# Patient Record
Sex: Male | Born: 1950 | Race: White | Hispanic: No | Marital: Married
Health system: Midwestern US, Community
[De-identification: ages and names within clinical notes are randomized; demographics above are authoritative.]

## PROBLEM LIST (undated history)

## (undated) DIAGNOSIS — K529 Noninfective gastroenteritis and colitis, unspecified: Secondary | ICD-10-CM

## (undated) DIAGNOSIS — E119 Type 2 diabetes mellitus without complications: Secondary | ICD-10-CM

## (undated) DIAGNOSIS — I519 Heart disease, unspecified: Secondary | ICD-10-CM

## (undated) DIAGNOSIS — K5190 Ulcerative colitis, unspecified, without complications: Secondary | ICD-10-CM

## (undated) HISTORY — PX: CARDIAC SURGERY: SHX584

## (undated) HISTORY — PX: CHOLECYSTECTOMY: SHX55

---

## 2016-12-26 ENCOUNTER — Ambulatory Visit (INDEPENDENT_AMBULATORY_CARE_PROVIDER_SITE_OTHER): Payer: 59

## 2016-12-26 ENCOUNTER — Ambulatory Visit (HOSPITAL_COMMUNITY)
Admission: EM | Admit: 2016-12-26 | Discharge: 2016-12-26 | Disposition: A | Discharge status: Home / Self Care | Payer: 59 | Attending: Internal Medicine | Admitting: Internal Medicine

## 2016-12-26 ENCOUNTER — Encounter (HOSPITAL_COMMUNITY): Payer: Self-pay | Admitting: *Deleted

## 2016-12-26 DIAGNOSIS — S82832A Other fracture of upper and lower end of left fibula, initial encounter for closed fracture: Secondary | ICD-10-CM

## 2016-12-26 HISTORY — DX: Heart disease, unspecified: I51.9

## 2016-12-26 HISTORY — DX: Type 2 diabetes mellitus without complications: E11.9

## 2016-12-26 MED ORDER — HYDROCODONE-ACETAMINOPHEN 5-325 MG PO TABS: 1.0000 | ORAL_TABLET | Freq: Four times a day (QID) | ORAL | 0 refills | Status: AC | PRN

## 2016-12-26 NOTE — Discharge Instructions (Signed)
There is a possibility of a nondisplaced distal fibula fracture. We are treating you today for fracture, and placed your foot in a cam walker. I have also prescribed a medicine for pain called hydrocodone, this medicine is a narcotic, it will cause drowsiness, and it is addictive. Do not take more than what is necessary, do not drink alcohol while taking, and do not operate any heavy machinery while taking this medicine. If you prefer, you may take over-the-counter ibuprofen with this medicine, he may also take over-the-counter Tylenol as well, however be aware that this medicine also has acetaminophen in it, and be careful not to go over 4000 mg of acetaminophen in any 24-hour period.  You will need to follow-up with an orthopedist for further care, and evaluation of your injury, since you are traveling, declined to make a referral to one of our local orthopedists. While you are traveling, take frequent breaks to walk, frequently flexure calf muscles, and whenever possible ambulate on the airplane. If your pain worsens, follow up with orthopedics, another urgent care by you're traveling, or if you're still in the local area, you may return to clinic

## 2016-12-26 NOTE — ED Triage Notes (Signed)
Pt  Reports  He  Twisted  His  l     Ankle        He  Has   Pain   And   Swelling to   l  Ankle  -    Pain  Is   Worse  On   Weight bearing

## 2016-12-26 NOTE — ED Provider Notes (Signed)
CSN: [id number]     Arrival date & time 12/26/16  1701 History   None    Chief Complaint  Patient presents with  . Ankle Pain   (Consider location/radiation/quality/duration/timing/severity/associated sxs/prior Treatment) 66 year old male presents to clinic with a chief complaint of left ankle pain. States he went to his son's graduation yesterday, and he fell in the parking lot twisting his ankle. Was able to walk afterwards, however pain is worsened over the last 24 hours, and is noted swelling to the ankle. Past medical history significant for diabetes, heart disease, states had 6 bypasses and open heart surgery. He has taken over-the-counter Aleve with some relief. Pain is aching, throbbing, dull, nonradiating with no other associated symptoms.   The history is provided by the patient.  Ankle Pain    Past Medical History:  Diagnosis Date  . Cardiac disease   . Diabetes mellitus without complication [facility])    Past Surgical History:  Procedure Laterality Date  . CARDIAC SURGERY    . CHOLECYSTECTOMY     History reviewed. No pertinent family history. Social History  Substance Use Topics  . Smoking status: Never Smoker  . Smokeless tobacco: Never Used  . Alcohol use Yes     Comment: socially    Review of Systems  Constitutional: Negative.   HENT: Negative.   Respiratory: Negative.   Cardiovascular: Negative.   Musculoskeletal: Positive for joint swelling.  Skin: Negative.   Neurological: Negative.     Allergies  Patient has no known allergies.  Home Medications   Prior to Admission medications   Medication Sig Start Date End Date Taking? Authorizing Provider  aspirin EC 81 MG tablet Take 81 mg by mouth daily.   Yes Historical Provider, MD  Atorvastatin Calcium (LIPITOR PO) Take by mouth.   Yes Historical Provider, MD  B Complex-C-Biotin-D-Zinc-FA (VITAL-D RX PO) Take by mouth.   Yes Historical Provider, MD  co-enzyme Q-10 30 MG capsule Take 30 mg by mouth 3 (three)  times daily.   Yes Historical Provider, MD  Empagliflozin (JARDIANCE PO) Take by mouth.   Yes Historical Provider, MD  KRILL OIL PO Take by mouth.   Yes Historical Provider, MD  Mesalamine (APRISO PO) Take by mouth.   Yes Historical Provider, MD  mesalamine (APRISO) 0.375 g 24 hr capsule Take 375 mg by mouth daily.   Yes Historical Provider, MD  PIOGLITAZONE HCL PO Take by mouth.   Yes Historical Provider, MD  Probiotic Product (ALIGN PO) Take by mouth.   Yes Historical Provider, MD  SAXagliptin-MetFORMIN (KOMBIGLYZE XR PO) Take by mouth.   Yes Historical Provider, MD  Sertraline HCl (ZOLOFT PO) Take by mouth.   Yes Historical Provider, MD  Valsartan (DIOVAN PO) Take by mouth.   Yes Historical Provider, MD  VALSARTAN PO Take by mouth.   Yes Historical Provider, MD  Vardenafil HCl (LEVITRA PO) Take by mouth.   Yes Historical Provider, MD  Zolpidem Tartrate (AMBIEN PO) Take by mouth.   Yes Historical Provider, MD  HYDROcodone-acetaminophen (NORCO/VICODIN) 5-325 MG tablet Take 1 tablet by mouth every 6 (six) hours as needed. 12/26/16   Dorena Bodo, NP   Meds Ordered and Administered this Visit  Medications - No data to display  BP (!) 145/83 (BP Location: Right Arm)   Pulse 78   Temp 98.6 F (37 C) (Oral)   Resp 18   SpO2 100%  No data found.   Physical Exam  Constitutional: He is oriented to person, place, and time.  He appears well-developed and well-nourished. No distress.  HENT:  Head: Normocephalic and atraumatic.  Right Ear: External ear normal.  Left Ear: External ear normal.  Eyes: Conjunctivae are normal.  Neck: Normal range of motion.  Musculoskeletal: He exhibits edema and tenderness.       Left ankle: He exhibits decreased range of motion and swelling. He exhibits normal pulse. Tenderness. Lateral malleolus tenderness found.  Neurological: He is alert and oriented to person, place, and time.  Skin: Skin is warm and dry. Capillary refill takes less than 2 seconds. He  is not diaphoretic.  Psychiatric: He has a normal mood and affect. His behavior is normal.  Nursing note and vitals reviewed.   Urgent Care Course     Procedures (including critical care time)  Labs Review Labs Reviewed - No data to display  Imaging Review Dg Ankle Complete Left  Result Date: 12/26/2016 CLINICAL DATA:  66 y/o M; ankle injury with lateral swelling and tenderness. EXAM: LEFT ANKLE COMPLETE - 3+ VIEW COMPARISON:  None. FINDINGS: Transverse lucency through the tip of the fibula below level of tibial plafond appears to have corticated margins and may represent an os subfibulare. Nondisplaced fracture is also possible. Soft tissue swelling overlying the lateral malleolus. Talar dome is intact. Ankle mortise is symmetric on these nonstress views. IMPRESSION: 1. Transverse lucency through the tip of the fibula below level of tibial plafond appears to have corticated margins and may represent an os subfibulare. Nondisplaced acute fracture is also possible. 2. Soft tissue swelling overlying the lateral malleolus. 3. No other possible fracture identified. Electronically Signed   By: Mitzi HansenLance  Furusawa-Stratton M.D.   On: 12/26/2016 17:47      MDM   1. Closed fracture of distal end of left fibula, unspecified fracture morphology, initial encounter    Treating for a nondisplaced distal fibula fracture. The placed in a cam walker, provided crutches, prescription given for hydrocodone. Recommend following up with orthopedics in one week for further evaluation and management of symptoms. Provided counseling on clot prophylaxis such as mobility and frequent ambulation as the patient is traveling, and will be flying.    Dorena BodoLawrence Elbert Polyakov, NP 12/26/16 1944

## 2018-08-20 IMAGING — DX DG ANKLE COMPLETE 3+V*L*
3 series · 3 of 3 positions shown · non-contrast
Comparison: None.

CLINICAL DATA: 65 y/o M; ankle injury with lateral swelling and
tenderness.

EXAM:
LEFT ANKLE COMPLETE - 3+ VIEW

[ankle ap]
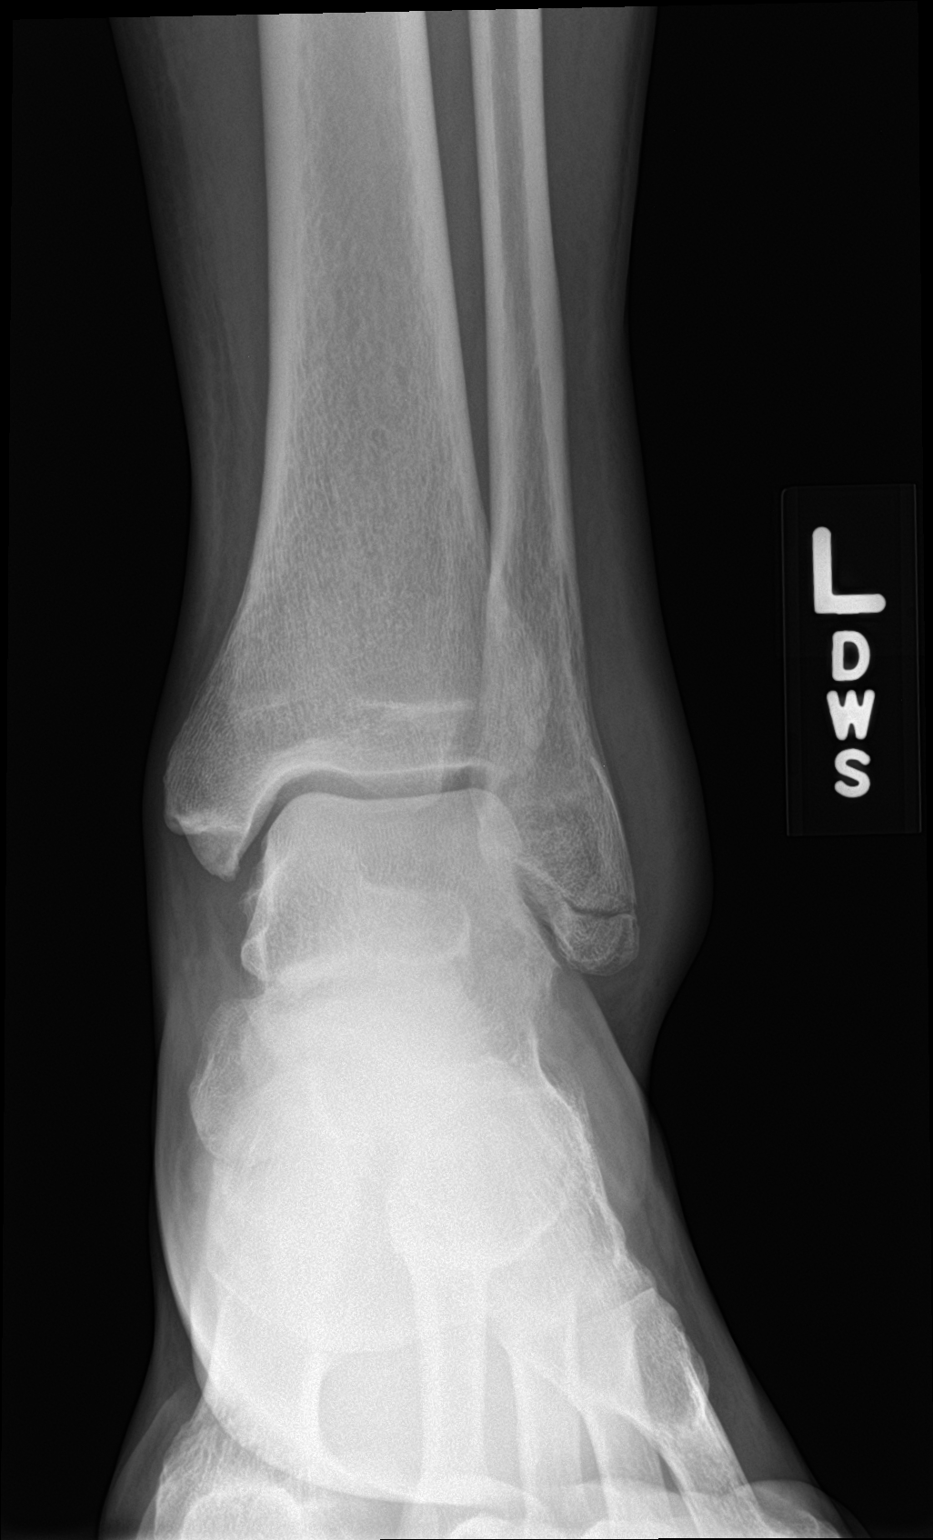

[ankle obl]
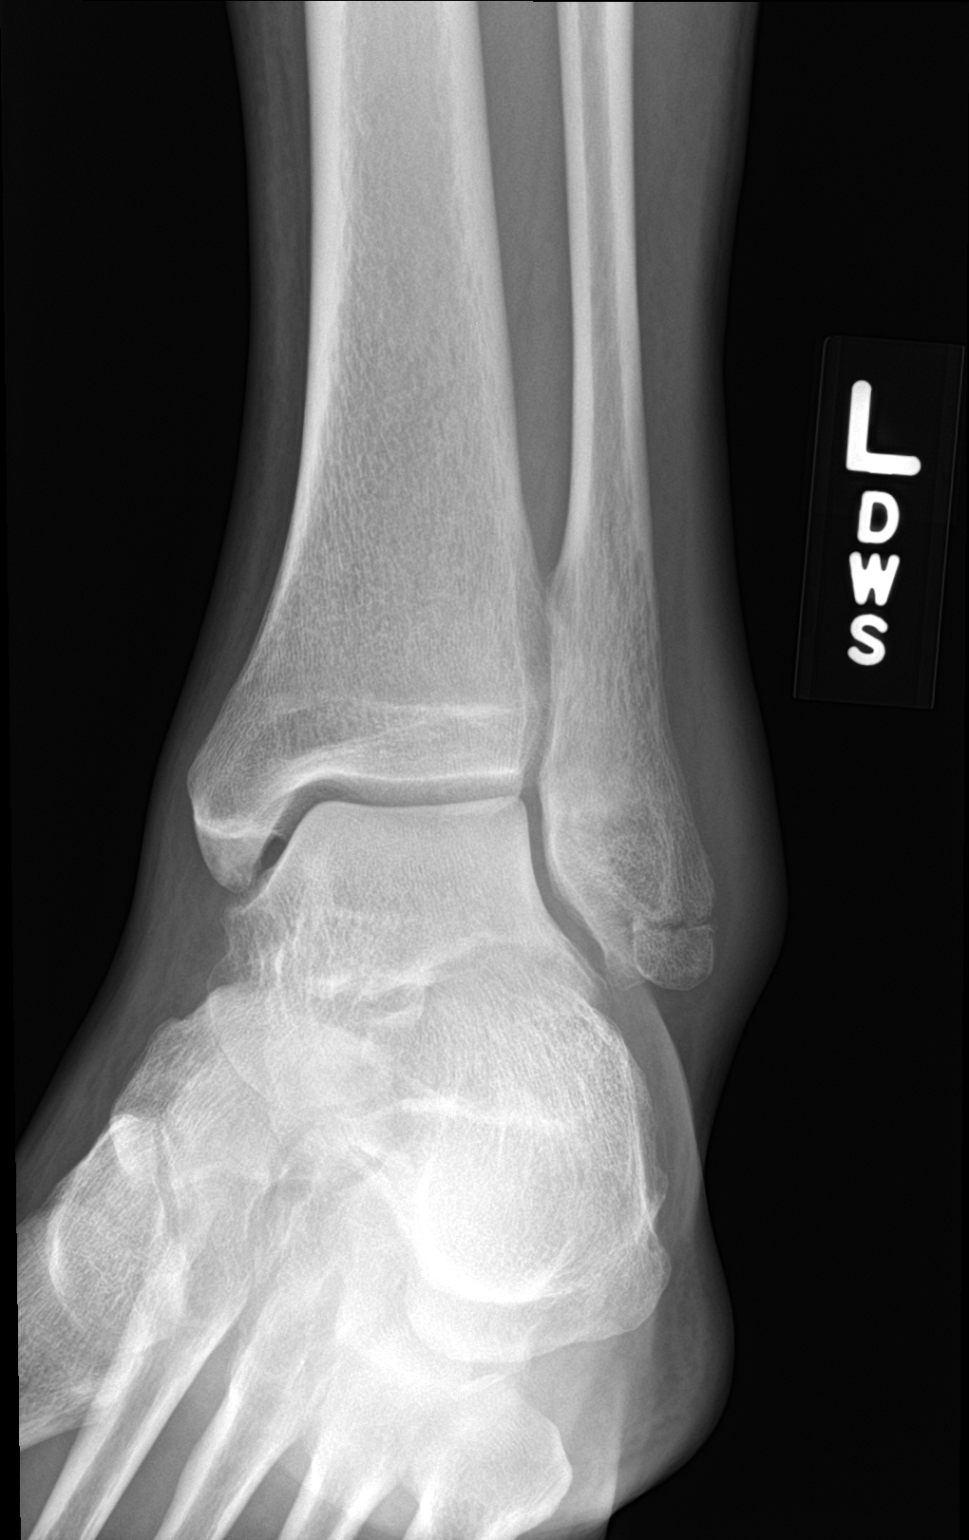

[ankle lat]
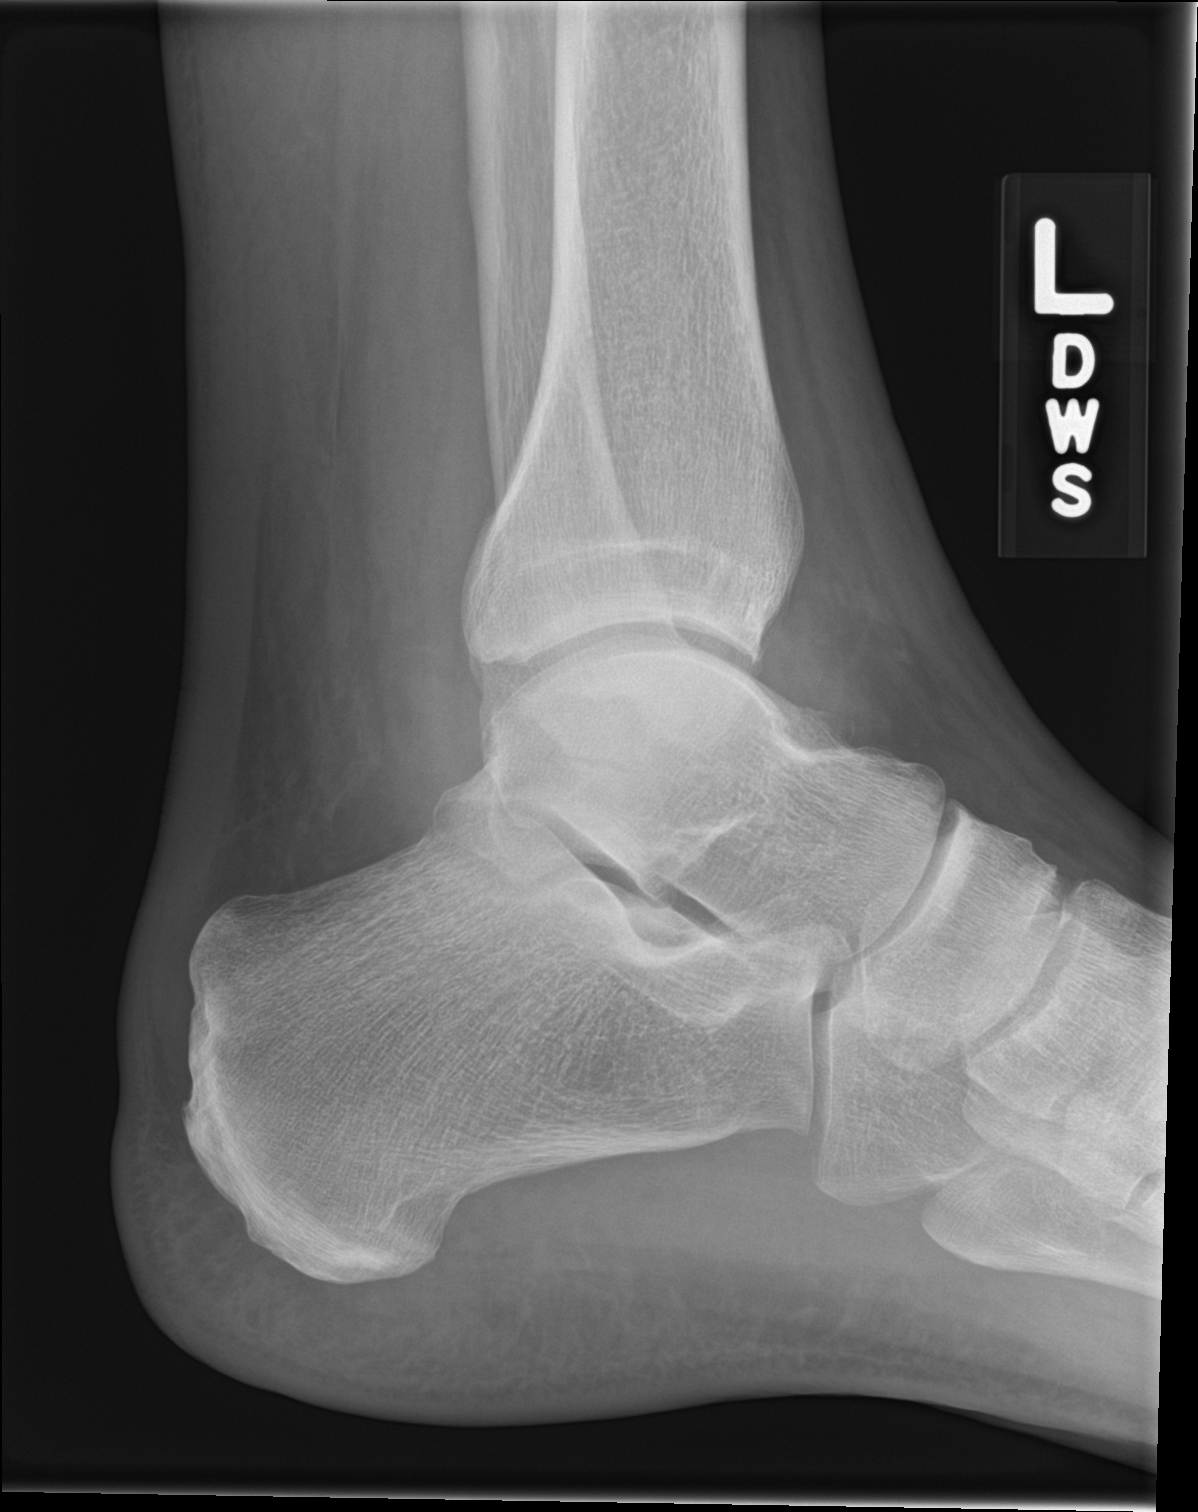

[3 of 3 positions shown; findings below may reference images not displayed]

FINDINGS: Transverse lucency through the tip of the fibula below level of
tibial plafond appears to have corticated margins and may represent
an os subfibulare. Nondisplaced fracture is also possible. Soft
tissue swelling overlying the lateral malleolus. Talar dome is
intact. Ankle mortise is symmetric on these nonstress views.
IMPRESSION: 1. Transverse lucency through the tip of the fibula below level of
tibial plafond appears to have corticated margins and may represent
an os subfibulare. Nondisplaced acute fracture is also possible.
2. Soft tissue swelling overlying the lateral malleolus.
3. No other possible fracture identified.

By: Mykola Marius M.D.

## 2021-08-16 ENCOUNTER — Inpatient Hospital Stay: Payer: MEDICARE

## 2021-08-16 MED ORDER — FENTANYL CITRATE (PF) 50 MCG/ML IJ SOLN
50 mcg/mL | INTRAMUSCULAR | Status: DC | PRN
Start: 2021-08-16 — End: 2021-08-16

## 2021-08-16 MED ORDER — ATROPINE 0.1 MG/ML SYRINGE
0.1 mg/mL | Freq: Once | INTRAMUSCULAR | Status: DC | PRN
Start: 2021-08-16 — End: 2021-08-16

## 2021-08-16 MED ORDER — SIMETHICONE 40 MG/0.6 ML ORAL DROPS, SUSP
40 mg/0.6 mL | ORAL | Status: DC | PRN
Start: 2021-08-16 — End: 2021-08-16
  Administered 2021-08-16: 13:00:00 via ORAL

## 2021-08-16 MED ORDER — FLUMAZENIL 0.1 MG/ML IV SOLN
0.1 mg/mL | INTRAVENOUS | Status: DC | PRN
Start: 2021-08-16 — End: 2021-08-16

## 2021-08-16 MED ORDER — LIDOCAINE (PF) 20 MG/ML (2 %) IJ SOLN
20 mg/mL (2 %) | INTRAMUSCULAR | Status: DC | PRN
Start: 2021-08-16 — End: 2021-08-16
  Administered 2021-08-16: 13:00:00 via INTRAVENOUS

## 2021-08-16 MED ORDER — EPINEPHRINE 0.1 MG/ML SYRINGE
0.1 mg/mL | Freq: Once | INTRAMUSCULAR | Status: DC | PRN
Start: 2021-08-16 — End: 2021-08-16

## 2021-08-16 MED ORDER — EPHEDRINE SULFATE 50 MG/ML INTRAVENOUS SOLUTION
50 mg/mL | INTRAVENOUS | Status: DC | PRN
Start: 2021-08-16 — End: 2021-08-16
  Administered 2021-08-16 (×3): via INTRAVENOUS

## 2021-08-16 MED ORDER — SODIUM CHLORIDE 0.9 % IV
INTRAVENOUS | Status: DC
Start: 2021-08-16 — End: 2021-08-16

## 2021-08-16 MED ORDER — SODIUM CHLORIDE 0.9 % IJ SYRG
INTRAMUSCULAR | Status: DC | PRN
Start: 2021-08-16 — End: 2021-08-16

## 2021-08-16 MED ORDER — SODIUM CHLORIDE 0.9 % IV
INTRAVENOUS | Status: DC | PRN
Start: 2021-08-16 — End: 2021-08-16
  Administered 2021-08-16: 13:00:00 via INTRAVENOUS

## 2021-08-16 MED ORDER — SODIUM CHLORIDE 0.9 % IJ SYRG
Freq: Three times a day (TID) | INTRAMUSCULAR | Status: DC
Start: 2021-08-16 — End: 2021-08-16

## 2021-08-16 MED ORDER — PROPOFOL 10 MG/ML IV EMUL
10 mg/mL | INTRAVENOUS | Status: DC | PRN
Start: 2021-08-16 — End: 2021-08-16
  Administered 2021-08-16 (×5): via INTRAVENOUS

## 2021-08-16 MED ORDER — EPHEDRINE SULFATE 50 MG/ML INTRAVENOUS SOLUTION
50 mg/mL | INTRAVENOUS | Status: DC | PRN
Start: 2021-08-16 — End: 2021-08-16
  Administered 2021-08-16: 13:00:00 via INTRAVENOUS

## 2021-08-16 MED ORDER — NALOXONE 0.4 MG/ML INJECTION
0.4 mg/mL | INTRAMUSCULAR | Status: DC | PRN
Start: 2021-08-16 — End: 2021-08-16

## 2021-08-16 MED ORDER — MIDAZOLAM 1 MG/ML IJ SOLN
1 mg/mL | INTRAMUSCULAR | Status: DC | PRN
Start: 2021-08-16 — End: 2021-08-16

## 2021-08-16 MED FILL — NORMAL SALINE FLUSH 0.9 % INJECTION SYRINGE: INTRAMUSCULAR | Qty: 40

## 2021-08-16 MED FILL — SODIUM CHLORIDE 0.9 % IV: INTRAVENOUS | Qty: 1000

## 2021-08-16 MED FILL — INFANTS GAS RELIEF 40 MG/0.6 ML ORAL DROPS,SUSPENSION: 40 mg/0.6 mL | ORAL | Qty: 30

## 2021-08-16 NOTE — H&P (Signed)
H&P by Orrin Brigham, MD at 08/16/21 [phone]                Author: Orrin Brigham, MD  Service: Gastroenterology  Author Type: Physician       Filed: 08/16/21 0809  Date of Service: 08/16/21 0809  Status: Signed          Editor: Orrin Brigham, MD (Physician)               [facility]   [address] [place]   [place], [place]   [phone]                                        History and Physical       NAME: Darryl Adkins    DOB:  04-02-1951    MRN:  [id number]       HPI:  The patient was seen and examined.        Past Surgical History:         Procedure  Laterality  Date          ?  HX CHOLECYSTECTOMY         ?  HX ORTHOPAEDIC              right toe          ?  PR CABG, ARTERY-VEIN, FOUR    2007          6 vessel by pass          Past Medical History:        Diagnosis  Date         ?  Autoimmune disease ([place])       ?  CAD (coronary artery disease)       ?  Diabetes ([place])           ?  Hypertension            Social History          Tobacco Use         ?  Smoking status:  Never     ?  Smokeless tobacco:  Never       Substance Use Topics         ?  Alcohol use:  Yes             Comment: rarely /monthly         ?  Drug use:  Never        No Known Allergies   History reviewed. No pertinent family history.     No current facility-administered medications for this encounter.          Facility-Administered Medications Ordered in Other Encounters          Medication  Dose  Route  Frequency           ?  0.9% sodium chloride infusion     IntraVENous  CONTINUOUS              PHYSICAL EXAM:   General: WD, WN. Alert, cooperative, no acute distress     HEENT: NC, Atraumatic.  PERRLA, EOMI. Anicteric sclerae.   Lungs:  CTA Bilaterally. No Wheezing/Rhonchi/Rales.   Heart:  Regular  rhythm,  No murmur, No Rubs, No Gallops   Abdomen: Soft, Non distended, Non tender.  +Bowel sounds,  no HSM   Extremities: No c/c/e   Neurologic:  CN 2-12 gi, Alert and oriented X 3.  No acute neurological  distress    Psych:   Good insight. Not anxious nor agitated.      The heart, lungs and mental status were satisfactory for the administration of MAC sedation and for the procedure.        Mallampati score: 2       The patient was counseled at length about the risks of contracting Covid-19 in the peri-operative and post-operative states including the recovery window of their procedure.  The patient was made aware that contracting Covid-19 after a surgical procedure  may worsen their prognosis for recovering from the virus and lend to a higher morbidity and or mortality risk.  The patient was given the options of postponing their procedure. All of the risks, benefits, and alternatives were discussed. The patient does  wish to proceed with the procedure.          Assessment:     ??   CAD on plavix, long standing UC     Plan:     ??  Endoscopic procedure : colonoscopy   ??  MAC sedation

## 2021-08-16 NOTE — Interval H&P Note (Signed)
.  Patient has been evaluated by anesthesia pre-procedure.    Patient alert and oriented.     Vital signs will not be charted by the Endoscopy nurse.     All vitals, airway, and loc are monitored by anesthesia staff throughout procedure.      .Endoscope was pre-cleaned at bedside immediately following procedure by CT

## 2021-08-16 NOTE — Progress Notes (Signed)
Report from Meg Kluck RN

## 2021-08-16 NOTE — Anesthesia Pre-Procedure Evaluation (Signed)
Relevant Problems   No relevant active problems       Anesthetic History   No history of anesthetic complications            Review of Systems / Medical History  Patient summary reviewed, nursing notes reviewed and pertinent labs reviewed    Pulmonary  Within defined limits                 Neuro/Psych   Within defined limits           Cardiovascular  Within defined limits  Hypertension          CAD         GI/Hepatic/Renal  Within defined limits              Endo/Other  Within defined limits  Diabetes         Other Findings              Physical Exam    Airway  Mallampati: II  TM Distance: > 6 cm  Neck ROM: normal range of motion   Mouth opening: Normal     Cardiovascular  Regular rate and rhythm,  S1 and S2 normal,  no murmur, click, rub, or gallop             Dental  No notable dental hx       Pulmonary  Breath sounds clear to auscultation               Abdominal  GI exam deferred       Other Findings            Anesthetic Plan    ASA: 2  Anesthesia type: MAC            Anesthetic plan and risks discussed with: Patient

## 2021-08-16 NOTE — Procedures (Signed)
Procedures  by Orrin Brigham, MD at 08/16/21 [phone]                Author: Orrin Brigham, MD  Service: Gastroenterology  Author Type: Physician       Filed: 08/16/21 0843  Date of Service: 08/16/21 0841  Status: Signed          Editor: Orrin Brigham, MD (Physician)               [facility]   [address] [place]   [place], [place]   [phone]                                      Colonoscopy Procedure Note         Indications:   Long standing pan-ulcerative colitis         Operator:  Orrin Brigham, MD      Staff: Endoscopy Technician-1: Rodney Booze   Endoscopy RN-1: Lars Masson, RN      Referring Provider: Coral Ceo, MD      Sedation:  MAC anesthesia      Procedure Details:  After informed consent was obtained with all risks and benefits of procedure explained and preoperative exam completed,  the patient was taken to the endoscopy suite and placed in the left lateral decubitus position.  Upon sequential sedation as per above, a digital rectal exam was performed per below.  The Olympus videocolonoscope was inserted in the rectum and carefully  advanced to the terminal ileum.  The quality of preparation was good.  Boston Bowel Prep Score :3/3/3.The colonoscope was slowly withdrawn with careful evaluation between folds. Retroflexion in the rectum was performed.       Findings:    Rectum: normal mucosa with subtle scarring from remote inflammation, small rectal vault, biopsies obtained    Sigmoid: minimal changes in vascularity with subtle scarring, biopsied    Descending Colon: normal mucosa, biopsied    Transverse Colon: normal mucosa, biopsied    Ascending Colon: normal mucosa, biopsied    Cecum: normal mucosa, biopsied    Terminal Ileum: normal mucosa, biopsied       Interventions:  biopsies       Specimen Removed:             ID  Type  Source  Tests  Collected by  Time  Destination             1 : illeum bx  Preservative      Orrin Brigham, MD   08/16/2021 0827  Pathology     2 : Right colon bx  Preservative      Orrin Brigham, MD  08/16/2021 [phone]  Pathology             3 : transverse colon bx  Preservative      Orrin Brigham, MD  08/16/2021 0831  Pathology             4 : Left colon bx  Preservative      Orrin Brigham, MD  08/16/2021 [phone]  Pathology             5 : rectum bx  Preservative      Orrin Brigham, MD  08/16/2021 [phone]  Pathology           Complications: None.  EBL:  minimal       Impression:     See Postoperative diagnosis above      Recommendations:    - Await pathology. You should receive a letter within 2 weeks.    - Resume normal medications. Continue mesalamine 2 tabs daily.    - Restart plavix in 24 hours.    - Recommend repeat colonoscopy in 2 years.       Discharge Disposition:  Home in the company of a driver when able to ambulate.      Orrin Brigham, MD   08/16/2021  8:41 AM

## 2021-08-16 NOTE — Anesthesia Post-Procedure Evaluation (Signed)
Procedure(s):  COLONOSCOPY  COLON BIOPSY.    MAC    Anesthesia Post Evaluation      Multimodal analgesia: multimodal analgesia not used between 6 hours prior to anesthesia start to PACU discharge  Patient location during evaluation: PACU  Patient participation: complete - patient participated  Level of consciousness: awake  Pain score: 0  Pain management: adequate  Airway patency: patent  Anesthetic complications: no  Cardiovascular status: acceptable  Respiratory status: acceptable  Hydration status: acceptable  Comments: I have evaluated the patient and meets criteria for discharge from PACU. Mariane Baumgarten., MD    Final Post Anesthesia Temperature Assessment:  Normothermia (36.0-37.5 degrees C)      INITIAL Post-op Vital signs: No vitals data found for the desired time range.

## 2021-08-16 NOTE — Anesthesia Pre-Procedure Evaluation (Signed)
Formatting of this note is different from the original.  Relevant Problems   No relevant active problems     Anesthetic History   No history of anesthetic complications        Review of Systems / Medical History  Patient summary reviewed, nursing notes reviewed and pertinent labs reviewed    Pulmonary  Within defined limits     Neuro/Psych   Within defined limits     Cardiovascular  Within defined limits  Hypertension    CAD      GI/Hepatic/Renal  Within defined limits      Endo/Other  Within defined limits  Diabetes     Other Findings       Physical Exam    Airway  Mallampati: II  TM Distance: > 6 cm  Neck ROM: normal range of motion   Mouth opening: Normal     Cardiovascular  Regular rate and rhythm,  S1 and S2 normal,  no murmur, click, rub, or gallop     Dental  No notable dental hx      Pulmonary  Breath sounds clear to auscultation     Abdominal  GI exam deferred     Other Findings       Anesthetic Plan    ASA: 2  Anesthesia type: MAC    Anesthetic plan and risks discussed with: Patient        Electronically signed by Teresita Madura., MD at 08/16/2021  8:07 AM EST

## 2021-08-16 NOTE — Anesthesia Post-Procedure Evaluation (Signed)
Formatting of this note might be different from the original.    Procedure(s):  COLONOSCOPY  COLON BIOPSY.    MAC    Anesthesia Post Evaluation    Multimodal analgesia: multimodal analgesia not used between 6 hours prior to anesthesia start to PACU discharge  Patient location during evaluation: PACU  Patient participation: complete - patient participated  Level of consciousness: awake  Pain score: 0  Pain management: adequate  Airway patency: patent  Anesthetic complications: no  Cardiovascular status: acceptable  Respiratory status: acceptable  Hydration status: acceptable  Comments: I have evaluated the patient and meets criteria for discharge from PACU. Teresita Madura., MD    Final Post Anesthesia Temperature Assessment:  Normothermia (36.0-37.5 degrees C)    INITIAL Post-op Vital signs: No vitals data found for the desired time range.    Electronically signed by Teresita Madura., MD at 08/16/2021  8:47 AM EST

## 2021-12-26 ENCOUNTER — Encounter: Payer: MEDICARE | Attending: Rehabilitative and Restorative Service Providers" | Primary: Internal Medicine

## 2022-01-07 ENCOUNTER — Inpatient Hospital Stay: Admit: 2022-01-07 | Payer: MEDICARE | Primary: Internal Medicine

## 2022-01-07 ENCOUNTER — Encounter: Payer: MEDICARE | Attending: Rehabilitative and Restorative Service Providers" | Primary: Internal Medicine

## 2022-01-07 DIAGNOSIS — M25551 Pain in right hip: Secondary | ICD-10-CM

## 2022-01-07 NOTE — Other (Signed)
Physical Therapy at [facility],   a part of [facility]  [address] [place], [address]  [place], [place] [address]  Phone: [phone]  Fax: [phone]       PHYSICAL THERAPY - MEDICARE EVALUATION/PLAN OF CARE NOTE (updated 3/23)      Date: 01/07/2022          Patient Name:  Darryl Adkins DOB:  [birth year]   Medical   Diagnosis:  Bilateral hip pain [M25.551, M25.552] Treatment Diagnosis:  R15.9    FULL INCONTINENCE OF FECES    Referral Source:  Madie Reno, MD Provider #:  [id number]                Insurance: Payor: Monia Pouch MEDICARE / Plan: [facility] / Product Type: *No Product type* /      Patient DOB verified yes     Visit #   Current  / Total 1 24   Time   In / Out 1015 1045   Total Treatment Time 30   Total Timed Codes 15   1:1 Treatment Time 15    MC BC Totals Reminder:  bill using total billable   min of TIMED therapeutic procedures and modalities.   8-22 min = 1 unit; 23-37 min = 2 units; 38-52 min = 3 units;  53-67 min = 4 units; 68-82 min = 5 units           SUBJECTIVE  Pain Level (0-10 scale): 0  [] constant [] intermittent [] improving [] worsening [] no change since onset    Any medication changes, allergies to medications, adverse drug reactions, diagnosis change, or new procedure performed?: [x]  No    []  Yes (see summary sheet for update)  Medications: Verified on Patient Summary List    Subjective functional status/changes:     Patient reports that he was sent here by his GI, Dr. .  He has a history of UC since age 80, with a mild colitis.  Managed with diet and stress management, reports having a flare every 5 years.  Recent colonoscopy was clear.  Patient's current symptoms include occasional FI.  He will have a late urge and can not hold in time to get to the bathroom.  He will leak liquid stool or hard small pebbles.  This happens when he is on walks and other exertional activities.    He reports that he is having type 1-2,  occasional type 5 BM every 3 days.  Some LBP, recently 'threw' his back out lifting weights.    Start of Care: 01/07/2022  Onset Date: constipation his whole life, started leaking in past year  Current symptoms/Complaints: LBP  Mechanism of Injury: UC  PLOF: walking, golfing, lifting weights  Limitations to PLOF/Activity or Recreational Limitations: golf, weight training (2-3x/week), walking 5x/week  Work Hx: retired Situation: lives with spouse in apartment  Mobility: wnl  Self Care: wnl  Previous Treatment/Compliance: PT for knee and shoulder  PMHx/Surgical Hx/Comorbidites: see chart  Prior Hospitalization:  n/a  Barriers: [] pain [] Financial [] time [] transportation [] Other:  Substance use: [] Alcohol [] Tobacco [] other:   Pt Goals: to reduce FI  Motivation: yes  Cognition: A & O x 4             OBJECTIVE      Due to patient time constraints, objective data collection was limited.        Patient instructed in the role of physical therapy in the evaluation and treatment  of pelvic floor dysfunction, applicable treatment modalities discussed. Expectations for regular home exercise participation and expected visit frequency in to achieve the patient's goals were discussed.        Patient instructed in pelvic floor anatomy and function including levator ani, puborectalis and superficial pelvic floor layer musculature.     Patient was provided dietary information to improve stool quality including increasing soluble fiber intake (Bran Buds), probiotics and increased water intake (6-8 glasses a day).  Pt instructed in use of [place] stool chart to track progress.     Patient educated in urinary urge suppression techniques including the KNACK, quick flicks, calmly walking rather than rushing to the toilet, nervous system down training.     Patient educated in proper defecation posture and technique including use of Squatty Potty for better puborectalis ms relaxation.  Pt to avoid straining to pass stool in  order to decrease strain on pelvic floor.           Objective/Functional Outcome Measure: 58  FOTO Score: 54  FOTO score = an established functional score where 100 = no disability      30 min [x] Eval - untimed                        Therapeutic Procedures:  Tx Min Billable or 1:1 Min (if diff from Tx Min) Procedure, Rationale, Specifics   15  [phone] Self Care/Home Management (timed):  improve patient knowledge and understanding of positioning, posture/ergonomics, activity modification, diagnosis/prognosis, and physical therapy expectations, procedures and progression  to improve patient's ability to progress to PLOF and address remaining functional goals.  (see flow sheet as applicable)    Details if applicable:     15 15    Total Total               [x]   Patient Education billed concurrently with other procedures   [x]  Review HEP    []  Progressed/Changed HEP, detail:    []  Other detail:         Pain Level at end of session (0-10 scale): 0    Plan of Care / Statement of Necessity for Physical Therapy Services     Assessment / key information:  Patient referred to pelvic PT for pelvic floor dysfunction.  He presents with signs and symptoms associated with fecal incontinence.  He presents with strength deficits, overactivity, poor coordination and awareness of the levator ani.  He will benefit from skilled PT to address these problems so that she can perform all ADLs at [facility].     Evaluation Complexity:  History:  MEDIUM  Complexity : 1-2 comorbidities / personal factors will impact the outcome/ POC ; Examination:  MEDIUM Complexity : 3 Standardized tests and measures addressin body structure, function, activity limitation and / or participation in recreation  ;Presentation:  MEDIUM Complexity : Evolving with changing characteristics  ;Clinical Decision Making:  MEDIUM Complexity : FOTO score of 26-74 Overall Complexity Rating: MEDIUM  Problem List: pain affecting function, decrease ROM, decrease strength, impaired  gait/balance, decrease ADL/functional abilities, decrease activity tolerance, and decrease flexibility/joint mobility   Treatment Plan may include any combination of the following: Therapeutic Exercise, 97112 Neuromuscular Re-Education, 97140 Manual Therapy, 97530 Therapeutic Activity, 97535 Self Care/Home Management, 97014 Electrical Stim unattended, 97116 Gait Training, and [phone] Ultrasound  Patient / Family readiness to learn indicated by: asking questions, trying to perform skills, interest, return verbalization , and return demonstration   Persons(s) to be  included in education: patient (P)  Barriers to Learning/Limitations: none  Measures taken if barriers to learning present: n/a  Patient Self Reported Health Status: good  Rehabilitation Potential: good    Short Term Goals: To be accomplished in 4 weeks  Patient will be independent with a progressive home exercise program without needed v.c.    Patient will demonstrate or verbalize all pelvic floor protection techniques as instructed by PT to allow for sit to stand transfer without pain   Patient will demonstrate improved pelvic floor muscle strength to a minimum 3+/5 bilaterally in order to decrease fecal incontinence symptoms by a minimum of 50%    Patient will be independent with urge suppression techniques, bladder irritant elimination in order to decrease bothersome fecal / urge incontinence episodes by a minimum of 50% per subjective report.      Long Term Goals: To be accomplished in 12 weeks  Patient will demonstrate a minimum of 4/5 pelvic floor muscle strength bilaterally in order to eliminate urinary incontinence symptoms as subjectively reported by patient  Patient will demonstrate 10 second pelvic floor muscle isometric holds x 3 repetitions at 4/5 strength level in order to demonstrate elimination of urinary incontinence symptoms.   Patient will demonstrate no loss of fecal with cough/sneeze as performed in clinical setting    Patient will  subjectively report no loss of bowel with patient's established urge triggers in home and community setting (key in door, pulling pants down)        Frequency / Duration: Patient to be seen 1-2 times per week for 12 weeks    Patient/ Caregiver education and instruction: Diagnosis, prognosis, self care, activity modification, and exercises [x]   Plan of care has been reviewed with PTA      Certification Period: 90 days      Darryl Adkins, PT       01/07/2022       10:12 AM        ===================================================================  I certify that the above Therapy Services are being furnished while the patient is under my care. I agree with the treatment plan and certify that this therapy is necessary.    Physician's Signature:_________________________   DATE:_________   TIME:________                           Madie RenoEdelman, Krista M, MD    ** Signature, Date and Time must be completed for valid certification **  Please sign and fax to [phone].  Thank you

## 2022-01-30 ENCOUNTER — Inpatient Hospital Stay: Admit: 2022-01-30 | Payer: MEDICARE | Primary: Internal Medicine

## 2022-01-30 DIAGNOSIS — M25551 Pain in right hip: Secondary | ICD-10-CM

## 2022-01-30 NOTE — Progress Notes (Signed)
PHYSICAL THERAPY - MEDICARE DAILY TREATMENT NOTE (updated 3/23)      Date: 01/30/2022          Patient Name:  Darryl Adkins DOB:  [birth year]   Medical   Diagnosis:  Bilateral hip pain [M25.551, M25.552] Treatment Diagnosis:  R15.9    FULL INCONTINENCE OF FECES    Referral Source:  Madie Reno, MD Insurance:   Payor: Monia Pouch MEDICARE / Plan: [facility] / Product Type: *No Product type* /                     Patient DOB verified yes     Visit #   Current  / Total 2 24   Time   In / Out 900 945   Total Treatment Time 45   Total Timed Codes 45   1:1 Treatment Time 45      MC BC Totals Reminder:  bill using total billable   min of TIMED therapeutic procedures and modalities.   8-22 min = 1 unit; 23-37 min = 2 units; 38-52 min = 3 units; 53-67 min = 4 units; 68-82 min = 5 units            SUBJECTIVE    Pain Level (0-10 scale): 0    Any medication changes, allergies to medications, adverse drug reactions, diagnosis change, or new procedure performed?: [x]  No    []  Yes (see summary sheet for update)  Medications: Verified on Patient Summary List    Subjective functional status/changes:     Patient has been doing abdominal massage every night.  Purchased All Bran (mixing with his cereal), and Magnesium Citrate.  Going to the bathroom daily and making a bulkier stool.    Still struggling with sleep.  Increasing his walking distance and speed.  No episodes of FI in past couple weeks.  Would like to learn some core exercises.    OBJECTIVE      Therapeutic Procedures:  Tx Min Billable or 1:1 Min (if diff from Tx Min) Procedure, Rationale, Specifics   25  Neuromuscular Re-Education (timed):  improve balance, coordination, kinesthetic sense, posture, core stability and proprioception to improve patient's ability to develop conscious control of individual muscles and awareness of position of extremities in order to progress to PLOF and address remaining functional goals. (see flow sheet as applicable)      Details if applicable:     20  97535 Self Care/Home Management (timed):  improve patient knowledge and understanding of pain reducing techniques, positioning, posture/ergonomics, home safety, and diagnosis/prognosis  to improve patient's ability to progress to PLOF and address remaining functional goals.  (see flow sheet as applicable)     Details if applicable:     45 45    Total Total             [x]   Patient Education billed concurrently with other procedures   [x]  Review HEP    []  Progressed/Changed HEP, detail:    []  Other detail:         Other Objective/Functional Measures  Gait analysis for lack of trunk rotation and arm swing    Pain Level at end of session (0-10 scale): 0      Assessment     Patient will continue to benefit from skilled PT / OT services to modify and progress therapeutic interventions, analyze and address functional mobility deficits, analyze and address ROM deficits, analyze and address strength deficits, analyze and  address soft tissue restrictions, analyze and cue for proper movement patterns, and analyze and modify for postural abnormalities to address functional deficits and attain remaining goals.    Progress toward goals / Updated goals:  []   See Progress Note/Recertification    Demonstrates good progress towards FI at this time.  Will progress with strengthening next visit.      PLAN  Yes  Continue plan of care  Re-Cert Due:  []   Upgrade activities as tolerated  []   Discharge due to :  []   Other:      62/26/3335, PT       01/30/2022       8:58 AM

## 2022-02-06 ENCOUNTER — Encounter: Payer: MEDICARE | Primary: Internal Medicine

## 2022-02-06 NOTE — Progress Notes (Signed)
PHYSICAL THERAPY - MEDICARE DAILY TREATMENT NOTE (updated 3/23)      Date: 02/06/2022          Patient Name:  Darryl Adkins DOB:  Oct 28, 1950   Medical   Diagnosis:  Bilateral hip pain [M25.551, M25.552] Treatment Diagnosis:  R15.9    FULL INCONTINENCE OF FECES    Referral Source:  Madie Reno, MD Insurance:   Payor: Monia Pouch MEDICARE / Plan: [facility] [place] [facility] / Product Type: *No Product type* /                     Patient DOB verified yes     Visit #   Current  / Total 3 24   Time   In / Out 900 945   Total Treatment Time 45   Total Timed Codes 45   1:1 Treatment Time 45      MC BC Totals Reminder:  bill using total billable   min of TIMED therapeutic procedures and modalities.   8-22 min = 1 unit; 23-37 min = 2 units; 38-52 min = 3 units; 53-67 min = 4 units; 68-82 min = 5 units            SUBJECTIVE    Pain Level (0-10 scale): 0    Any medication changes, allergies to medications, adverse drug reactions, diagnosis change, or new procedure performed?: [x]  No    []  Yes (see summary sheet for update)  Medications: Verified on Patient Summary List    Subjective functional status/changes:       Patient reports that he injured his back squatting with kettle bells.  Nervous that he will not be able to perform his stress test at the cardiologist this week.    Continues to report that he is having daily BM's and improved sleep with his new diet regimen.    OBJECTIVE      Therapeutic Procedures:  Tx Min Billable or 1:1 Min (if diff from Tx Min) Procedure, Rationale, Specifics   30  Neuromuscular Re-Education (timed):  improve balance, coordination, kinesthetic sense, posture, core stability and proprioception to improve patient's ability to develop conscious control of individual muscles and awareness of position of extremities in order to progress to PLOF and address remaining functional goals. (see flow sheet as applicable)     Details if applicable:     15  97535 Self Care/Home Management (timed):   improve patient knowledge and understanding of pain reducing techniques, positioning, posture/ergonomics, home safety, and diagnosis/prognosis  to improve patient's ability to progress to PLOF and address remaining functional goals.  (see flow sheet as applicable)     Details if applicable:     45 45    Total Total             [x]   Patient Education billed concurrently with other procedures   [x]  Review HEP    []  Progressed/Changed HEP, detail:    []  Other detail:         Other Objective/Functional Measures  PEC pattering.  Functional squat level 1. FF: 10 inches.  Tolerated paravertebral inhibition well.    Pain Level at end of session (0-10 scale): 0      Assessment     Patient will continue to benefit from skilled PT / OT services to modify and progress therapeutic interventions, analyze and address functional mobility deficits, analyze and address ROM deficits, analyze and address strength deficits, analyze and address soft tissue restrictions, analyze  and cue for proper movement patterns, and analyze and modify for postural abnormalities to address functional deficits and attain remaining goals.    Progress toward goals / Updated goals:  []   See Progress Note/Recertification    Improved subjective reports of FI.  Continues to present with LB limitations and will require continued PT to advance towards LTG's      PLAN  Yes  Continue plan of care  Re-Cert Due:  []   Upgrade activities as tolerated  []   Discharge due to :  []   Other:      07/37/1062, PT       02/06/2022       8:59 AM

## 2022-02-13 ENCOUNTER — Inpatient Hospital Stay: Admit: 2022-02-13 | Payer: MEDICARE | Primary: Internal Medicine

## 2022-02-13 NOTE — Progress Notes (Signed)
Physical Therapy at [facility],   a part of [facility]  [address] [place], [place]  [place], [place]  Phone: [phone]  Fax: [phone]  PHYSICAL THERAPY PROGRESS NOTE  Patient Name:  Darryl Adkins DOB:  1950-09-22   Treatment/Medical Diagnosis: Bilateral hip pain [M25.551, M25.552]   Referral Source:  Orrin Brigham, MD     Date of Initial Visit:  01/07/2022 Attended Visits:  4 Missed Visits:  0     SUMMARY OF TREATMENT/ASSESSMENT:  Patient is making steady progress towards all Constipation goals at this time.    CURRENT STATUS      Patient will demonstrate or verbalize all pelvic floor protection techniques as instructed by PT to allow for sit to stand transfer without pain   Status at last Eval/Progress Note: n/a  Current Status: met  Goal Met?  yes    2.  Patient will demonstrate improved pelvic floor muscle strength to a minimum 3+/5 bilaterally in order to decrease fecal incontinence symptoms by a minimum of 50%    Status at last Eval/Progress Note: n/a  Current Status: 75%  Goal Met?  no    3. Patient will be independent with urge suppression techniques, bladder irritant elimination in order to decrease bothersome fecal / urge incontinence episodes by a minimum of 50% per subjective report.   Status at last Eval/Progress Note: n/a  Current Status: met  Goal Met?  no    4. Patient will report worst pain level of 4/10 or better to allow for a minimum of 6 hours of interrupted sleeping ability.  Status at last Eval/Progress Note: 50%  Current Status: no  Goal Met?  no        RECOMMENDATIONS  Will continue to benefit from skilled PT to address LBP, ROM and strength deficits for continued progress towards goals        Mal Amabile, PT       02/13/2022       12:43 PM    If you have any questions/comments please contact us directly at [phone].   Thank you for allowing [place] to assist in the care of your patient.

## 2022-02-13 NOTE — Progress Notes (Signed)
PHYSICAL THERAPY - MEDICARE DAILY TREATMENT NOTE (updated 3/23)      Date: 02/13/2022          Patient Name:  Darryl Adkins DOB:  10-Aug-1951   Medical   Diagnosis:  Bilateral hip pain [M25.551, M25.552] Treatment Diagnosis:  R15.9    FULL INCONTINENCE OF FECES    Referral Source:  Madie Reno, MD Insurance:   Payor: Monia Pouch MEDICARE / Plan: [facility] / Product Type: *No Product type* /                     Patient DOB verified yes     Visit #   Current  / Total 4 24   Time   In / Out 900 945   Total Treatment Time 45   Total Timed Codes 45   1:1 Treatment Time 45      MC BC Totals Reminder:  bill using total billable   min of TIMED therapeutic procedures and modalities.   8-22 min = 1 unit; 23-37 min = 2 units; 38-52 min = 3 units; 53-67 min = 4 units; 68-82 min = 5 units            SUBJECTIVE    Pain Level (0-10 scale): 5/10    Any medication changes, allergies to medications, adverse drug reactions, diagnosis change, or new procedure performed?: [x]  No    []  Yes (see summary sheet for update)  Medications: Verified on Patient Summary List    Subjective functional status/changes:       Patient reports that he passed his TM stress test.  Now he has a lot of groin, hip, and LBP.    OBJECTIVE      Therapeutic Procedures:  Tx Min Billable or 1:1 Min (if diff from Tx Min) Procedure, Rationale, Specifics   45  Neuromuscular Re-Education (timed):  improve balance, coordination, kinesthetic sense, posture, core stability and proprioception to improve patient's ability to develop conscious control of individual muscles and awareness of position of extremities in order to progress to PLOF and address remaining functional goals. (see flow sheet as applicable)     Details if applicable:     nt  97535 Self Care/Home Management (timed):  improve patient knowledge and understanding of pain reducing techniques, positioning, posture/ergonomics, home safety, and diagnosis/prognosis  to improve  patient's ability to progress to PLOF and address remaining functional goals.  (see flow sheet as applicable)     Details if applicable:     45 45    Total Total             [x]   Patient Education billed concurrently with other procedures   [x]  Review HEP    []  Progressed/Changed HEP, detail:    []  Other detail:         Other Objective/Functional Measures  PEC pattering.  Functional squat level 1. FF: 10 inches.      Increased time spent on Low back inhibition today.  Improved pain symptoms s/p treatment    Pain Level at end of session (0-10 scale): 2/10      Assessment     Patient will continue to benefit from skilled PT / OT services to modify and progress therapeutic interventions, analyze and address functional mobility deficits, analyze and address ROM deficits, analyze and address strength deficits, analyze and address soft tissue restrictions, analyze and cue for proper movement patterns, and analyze and modify for postural abnormalities to address  functional deficits and attain remaining goals.    Progress toward goals / Updated goals:  [x]   See Progress Note/Recertification    Improved subjective reports of FI.  Continues to present with LB limitations and will require continued PT to advance towards LTG's      PLAN  Yes  Continue plan of care  Re-Cert Due:  []   Upgrade activities as tolerated  []   Discharge due to :  []   Other:      58/04/9832, PT       02/13/2022       9:02 AM

## 2022-02-28 ENCOUNTER — Inpatient Hospital Stay: Admit: 2022-02-28 | Payer: MEDICARE | Primary: Internal Medicine

## 2022-02-28 DIAGNOSIS — M25551 Pain in right hip: Secondary | ICD-10-CM

## 2022-02-28 NOTE — Progress Notes (Signed)
PHYSICAL THERAPY - MEDICARE DAILY TREATMENT NOTE (updated 3/23)      Date: 02/28/2022          Patient Name:  Darryl Adkins DOB:  11/04/1950   Medical   Diagnosis:  Bilateral hip pain [M25.551, M25.552] Treatment Diagnosis:  R15.9    FULL INCONTINENCE OF FECES    Referral Source:  Madie Reno, MD Insurance:   Payor: Monia Pouch MEDICARE / Plan: [facility] / Product Type: *No Product type* /                     Patient DOB verified yes     Visit #   Current  / Total 5 24   Time   In / Out 900 945   Total Treatment Time 45   Total Timed Codes 45   1:1 Treatment Time 45      MC BC Totals Reminder:  bill using total billable   min of TIMED therapeutic procedures and modalities.   8-22 min = 1 unit; 23-37 min = 2 units; 38-52 min = 3 units; 53-67 min = 4 units; 68-82 min = 5 units            SUBJECTIVE    Pain Level (0-10 scale): 5/10    Any medication changes, allergies to medications, adverse drug reactions, diagnosis change, or new procedure performed?: [x]  No    []  Yes (see summary sheet for update)  Medications: Verified on Patient Summary List    Subjective functional status/changes:       Patient reports that his back is doing better, no constipation.  Would like to know how to exercise in the gym without injuring himself.    OBJECTIVE      Therapeutic Procedures:  Tx Min Billable or 1:1 Min (if diff from Tx Min) Procedure, Rationale, Specifics   45  Neuromuscular Re-Education (timed):  improve balance, coordination, kinesthetic sense, posture, core stability and proprioception to improve patient's ability to develop conscious control of individual muscles and awareness of position of extremities in order to progress to PLOF and address remaining functional goals. (see flow sheet as applicable)     Details if applicable:     nt  97535 Self Care/Home Management (timed):  improve patient knowledge and understanding of pain reducing techniques, positioning, posture/ergonomics, home safety,  and diagnosis/prognosis  to improve patient's ability to progress to PLOF and address remaining functional goals.  (see flow sheet as applicable)     Details if applicable:     45 45    Total Total             [x]   Patient Education billed concurrently with other procedures   [x]  Review HEP    []  Progressed/Changed HEP, detail:    []  Other detail:         Other Objective/Functional Measures  PEC pattering.  Functional squat level 1. FF: 10 inches.  (No change)    Tolerated reverse squatting, and bar reach well today.  Reviewed posture and breath technique with exercise today.    I    Pain Level at end of session (0-10 scale): 2/10      Assessment     Patient will continue to benefit from skilled PT / OT services to modify and progress therapeutic interventions, analyze and address functional mobility deficits, analyze and address ROM deficits, analyze and address strength deficits, analyze and address soft tissue restrictions, analyze and cue for  proper movement patterns, and analyze and modify for postural abnormalities to address functional deficits and attain remaining goals.    Progress toward goals / Updated goals:  [x]   See Progress Note/Recertification    Will advance PF and TrA strengthening exercises with gym integration next 2 visits      PLAN  Yes  Continue plan of care  Re-Cert Due:  []   Upgrade activities as tolerated  []   Discharge due to :  []   Other:      69/45/0388, PT       02/28/2022       9:01 AM

## 2022-03-06 ENCOUNTER — Inpatient Hospital Stay: Admit: 2022-03-06 | Payer: MEDICARE | Primary: Internal Medicine

## 2022-03-06 NOTE — Progress Notes (Signed)
PHYSICAL THERAPY - MEDICARE DAILY TREATMENT NOTE (updated 3/23)      Date: 03/06/2022          Patient Name:  Darryl Adkins DOB:  04-16-1951   Medical   Diagnosis:  Bilateral hip pain [M25.551, M25.552] Treatment Diagnosis:  R15.9    FULL INCONTINENCE OF FECES    Referral Source:  Madie Reno, MD Insurance:   Payor: Monia Pouch MEDICARE / Plan: [employer] Crumb PPO / Product Type: Medicare /                     Patient DOB verified yes     Visit #   Current  / Total 6 24   Time   In / Out 900 945   Total Treatment Time 45   Total Timed Codes 45   1:1 Treatment Time 45      MC BC Totals Reminder:  bill using total billable   min of TIMED therapeutic procedures and modalities.   8-22 min = 1 unit; 23-37 min = 2 units; 38-52 min = 3 units; 53-67 min = 4 units; 68-82 min = 5 units            SUBJECTIVE    Pain Level (0-10 scale): 0/10    Any medication changes, allergies to medications, adverse drug reactions, diagnosis change, or new procedure performed?: [x]  No    []  Yes (see summary sheet for update)  Medications: Verified on Patient Summary List    Subjective functional status/changes:       Still unsure of how to prevent injury in the gym.      OBJECTIVE      Therapeutic Procedures:  Tx Min Billable or 1:1 Min (if diff from Tx Min) Procedure, Rationale, Specifics   45  Neuromuscular Re-Education (timed):  improve balance, coordination, kinesthetic sense, posture, core stability and proprioception to improve patient's ability to develop conscious control of individual muscles and awareness of position of extremities in order to progress to PLOF and address remaining functional goals. (see flow sheet as applicable)     Details if applicable:     45 45    Total Total             [x]   Patient Education billed concurrently with other procedures   [x]  Review HEP    []  Progressed/Changed HEP, detail:    []  Other detail:         Other Objective/Functional Measures  Patient with 3/3/3 DRAM with sit-up.   Demonstrates good performance of TrA and stabilization of gap with exercise today    Pain Level at end of session (0-10 scale): 2/10      Assessment     Patient will continue to benefit from skilled PT / OT services to modify and progress therapeutic interventions, analyze and address functional mobility deficits, analyze and address ROM deficits, analyze and address strength deficits, analyze and address soft tissue restrictions, analyze and cue for proper movement patterns, and analyze and modify for postural abnormalities to address functional deficits and attain remaining goals.    Progress toward goals / Updated goals:  [x]   See Progress Note/Recertification    Continues to demonstrate good potential to meet all goals      PLAN  Yes  Continue plan of care  Re-Cert Due:  []   Upgrade activities as tolerated  []   Discharge due to :  []   Other:      , PT  03/06/2022       9:03 AM

## 2022-03-13 ENCOUNTER — Inpatient Hospital Stay: Admit: 2022-03-13 | Payer: MEDICARE | Primary: Internal Medicine

## 2022-03-13 NOTE — Progress Notes (Signed)
PHYSICAL THERAPY - MEDICARE DAILY TREATMENT NOTE (updated 3/23)      Date: 03/13/2022          Patient Name:  Darryl Adkins DOB:  03-26-1951   Medical   Diagnosis:  Bilateral hip pain [M25.551, M25.552] Treatment Diagnosis:  R15.9    FULL INCONTINENCE OF FECES    Referral Source:  Orrin Brigham, MD Insurance:   Payor: Holland Falling MEDICARE / Plan: Tyrone Apple PPO / Product Type: Medicare /                     Patient DOB verified yes     Visit #   Current  / Total 7 24   Time   In / Out 900 945   Total Treatment Time 45   Total Timed Codes 45   1:1 Treatment Time 47      MC BC Totals Reminder:  bill using total billable   min of TIMED therapeutic procedures and modalities.   8-22 min = 1 unit; 23-37 min = 2 units; 38-52 min = 3 units; 53-67 min = 4 units; 68-82 min = 5 units            SUBJECTIVE    Pain Level (0-10 scale): 0/10    Any medication changes, allergies to medications, adverse drug reactions, diagnosis change, or new procedure performed?: [x]  No    []  Yes (see summary sheet for update)  Medications: Verified on Patient Summary List    Subjective functional status/changes:     Continues to manage FI with diet and magnesium.  He is walking more without difficulty.  Playing golf 3-4x/week.     OBJECTIVE      Therapeutic Procedures:  Tx Min Billable or 1:1 Min (if diff from Tx Min) Procedure, Rationale, Specifics   45  [id number] Neuromuscular Re-Education (timed):  improve balance, coordination, kinesthetic sense, posture, core stability and proprioception to improve patient's ability to develop conscious control of individual muscles and awareness of position of extremities in order to progress to PLOF and address remaining functional goals. (see flow sheet as applicable)     Details if applicable:     45 45    Total Total             [x]   Patient Education billed concurrently with other procedures   [x]  Review HEP    []  Progressed/Changed HEP, detail:    []  Other detail:         Other  Objective/Functional Measures  Patient demonstrates improved performance of balance and strength exercises with focus on breathing and core facilitation    Pain Level at end of session (0-10 scale): 2/10      Assessment     Patient will continue to benefit from skilled PT / OT services to modify and progress therapeutic interventions, analyze and address functional mobility deficits, analyze and address ROM deficits, analyze and address strength deficits, analyze and address soft tissue restrictions, analyze and cue for proper movement patterns, and analyze and modify for postural abnormalities to address functional deficits and attain remaining goals.    Progress toward goals / Updated goals:  [x]   See Progress Note/Recertification  Has met 90% of goals at this time.  Good candidate for D/C next visit      PLAN  Yes  Continue plan of care  Re-Cert Due: 73/22/0254  []   Upgrade activities as tolerated  []   Discharge due to :  []   Other:  Mal Amabile, PT       03/13/2022       9:02 AM

## 2022-03-13 NOTE — Progress Notes (Addendum)
Physical Therapy at [facility],   a part of [facility]  [address] [place], [place]  [place], [place]  Phone: [phone]  Fax: [phone]    DISCHARGE SUMMARY  Patient Name: Darryl Adkins DOB: [birth year]   Treatment/Medical Diagnosis: Bilateral hip pain [M25.551, M25.552]   Referral Source: Orrin Brigham, MD     Date of Initial Visit: 01/07/22 Attended Visits: 7 Missed Visits: 0     SUMMARY OF TREATMENT    Pt attended initial evaluation and     7     follow-ups and then did not return for further intervention.  Therefore, a formal reassessment of goals was not performed for this patient. Per last visit, he was making steady progress towards goals, and was provided with HEP for Sx Management.      RECOMMENDATIONS  Discontinue therapy due to lack of attendance, abdication or compliance.        Mal Amabile, PT       11/04/2022       1:03 PM    If you have any questions/comments please contact us directly at [phone].   Thank you for allowing [place] to assist in the care of your patient.

## 2022-04-04 ENCOUNTER — Encounter: Payer: MEDICARE | Primary: Internal Medicine

## 2023-07-31 ENCOUNTER — Encounter
Admit: 2023-07-31 | Discharge: 2023-08-04 | Disposition: A | Discharge status: Home / Self Care | Payer: MEDICARE | Source: Ambulatory Visit | Primary: Internal Medicine

## 2023-07-31 DIAGNOSIS — M25511 Pain in right shoulder: Secondary | ICD-10-CM

## 2023-07-31 NOTE — Other (Signed)
 Physical Therapy at [facility],   a part of [facility]  [address] [place], [address]  [place], [place] [address]  Phone: [phone]  Fax: [phone]       PHYSICAL THERAPY - MEDICARE EVALUATION/PLAN OF CARE NOT

## 2023-08-08 ENCOUNTER — Inpatient Hospital Stay
Admit: 2023-08-08 | Payer: MEDICARE | Attending: Rehabilitative and Restorative Service Providers" | Primary: Internal Medicine

## 2023-08-08 NOTE — Progress Notes (Signed)
 PHYSICAL THERAPY - MEDICARE DAILY TREATMENT NOTE (updated 3/23)      Date: 08/08/2023          Patient Name:  Darryl Adkins DOB:  Apr 26, 1951   Medical   Diagnosis:  Bilateral shoulder pain [M25.511, M25.512] Treatment Diagnosis:  M25.511  RIGHT SHOULDER PAIN

## 2023-08-14 ENCOUNTER — Encounter: Payer: MEDICARE | Primary: Internal Medicine

## 2023-08-14 NOTE — Progress Notes (Signed)
 PHYSICAL THERAPY - MEDICARE DAILY TREATMENT NOTE (updated 3/23)      Date: 08/14/2023          Patient Name:  Darryl Adkins DOB:  Jan 01, 1951   Medical   Diagnosis:  Bilateral shoulder pain [M25.511, M25.512] Treatment Diagnosis:  M25.511  RIGHT SHOULDER PAIN

## 2023-08-19 ENCOUNTER — Encounter: Payer: MEDICARE | Attending: Rehabilitative and Restorative Service Providers" | Primary: Internal Medicine

## 2023-08-19 NOTE — Progress Notes (Signed)
 PHYSICAL THERAPY - MEDICARE DAILY TREATMENT NOTE (updated 3/23)      Date: 08/19/2023          Patient Name:  Darryl Adkins DOB:  [birth year]   Medical   Diagnosis:  Bilateral shoulder pain [M25.511, M25.512] Treatment Diagnosis:  M25.511  RIGHT SHOULDER PAIN and M25.512  LEFT SHOULDER PAIN    Referral Source:  Clarice Croissant, MD Insurance:   Payor: Raina Bunting MEDICARE / Plan: Cosette Dinning PPO / Product Type: Medicare /                     Patient DOB verified yes     Visit #   Current  / Total 4 24   Time   In / Out 11:25 AM 12:05 PM   Total Treatment Time 40   Total Timed Codes 40   1:1 Treatment Time 40      MC BC Totals Reminder:  bill using total billable   min of TIMED therapeutic procedures and modalities.   8-22 min = 1 unit; 23-37 min = 2 units; 38-52 min = 3 units; 53-67 min = 4 units; 68-82 min = 5 units            SUBJECTIVE    Pain Level (0-10 scale): 1/10    Any medication changes, allergies to medications, adverse drug reactions, diagnosis change, or new procedure performed?: [x]  No    []  Yes (see summary sheet for update)  Medications: Verified on Patient Summary List    Subjective functional status/changes:     Patient is feeling better overall, but still has limitations with FER & FIR, especially on the R shoulder.    OBJECTIVE      Therapeutic Procedures:  Tx Min Billable or 1:1 Min (if diff from Tx Min) Procedure, Rationale, Specifics   40  97110 Therapeutic Exercise (timed):  increase ROM, strength, coordination, balance, and proprioception to improve patient's ability to progress to PLOF and address remaining functional goals. (see flow sheet as applicable)     Details if applicable:     40     Total Total     [x]   Patient Education billed concurrently with other procedures   [x]  Review HEP    []  Progressed/Changed HEP, detail:    []  Other detail:         Other Objective/Functional Measures    Pain Level at end of session (0-10 scale): 0      Assessment   No increase in shoulder pain  with new there ex program. Updated HEP.  Patient will continue to benefit from skilled PT / OT services to modify and progress therapeutic interventions, analyze and address functional mobility deficits, analyze and address ROM deficits, analyze and address strength deficits, analyze and address soft tissue restrictions, analyze and cue for proper movement patterns, and analyze and modify for postural abnormalities to address functional deficits and attain remaining goals.    Progress toward goals / Updated goals:  []   See Progress Note/Recertification    Slowly progress towards goals      PLAN  Yes  Continue plan of care  Re-Cert Due: n/a  [x]   Upgrade activities as tolerated  []   Discharge due to:  []   Other:      Narciso Backers, PT       08/19/2023       11:38 AM

## 2023-08-28 ENCOUNTER — Encounter
Admit: 2023-08-28 | Discharge: 2023-09-01 | Disposition: A | Discharge status: Home / Self Care | Payer: MEDICARE | Source: Ambulatory Visit | Primary: Internal Medicine

## 2023-08-28 DIAGNOSIS — M25511 Pain in right shoulder: Principal | ICD-10-CM

## 2023-08-28 NOTE — Progress Notes (Signed)
 PHYSICAL THERAPY - MEDICARE DAILY TREATMENT NOTE (updated 3/23)      Date: 08/28/2023          Patient Name:  Darryl Adkins DOB:  10/27/1950   Medical   Diagnosis:  Bilateral shoulder pain [M25.511, M25.512] Treatment Diagnosis:  M25.511  RIGHT SHOULDER PAIN and M25.512  LEFT SHOULDER PAIN    Referral Source:  Vannie Donnice DEL, MD Insurance:   Payor: [employer] MEDICARE / Plan: HULAN BOEHRINGER PPO / Product Type: Medicare /                     Patient DOB verified YES    Visit #   Current  / Total 5 24   Time   In / Out 9:15 10:05   Total Treatment Time 50   Total Timed Codes 50   1:1 Treatment Time 50      MC BC Totals Reminder:  bill using total billable   min of TIMED therapeutic procedures and modalities.   8-22 min = 1 unit; 23-37 min = 2 units; 38-52 min = 3 units; 53-67 min = 4 units; 68-82 min = 5 units        3TE      SUBJECTIVE    Pain Level (0-10 scale): 0/10    Any medication changes, allergies to medications, adverse drug reactions, diagnosis change, or new procedure performed?: [x]  No    []  Yes (see summary sheet for update)  Medications: Verified on Patient Summary List    Subjective functional status/changes:     Pt reported feeling fatigued due to sickness in the past week    OBJECTIVE      Therapeutic Procedures:  Tx Min Billable or 1:1 Min (if diff from Tx Min) Procedure, Rationale, Specifics   50  97110 Therapeutic Exercise (timed):  increase ROM, strength, coordination, balance, and proprioception to improve patient's ability to progress to PLOF and address remaining functional goals. (see flow sheet as applicable)     Details if applicable:     50     Total Total         [x]   Patient Education billed concurrently with other procedures   [x]  Review HEP    []  Progressed/Changed HEP, detail:    []  Other detail:         Other Objective/Functional Measures  Pt became fatigued w/ strengthening exercises at end of session    Pain Level at end of session (0-10 scale): 0/10      Assessment     Patient  will continue to benefit from skilled PT / OT services to modify and progress therapeutic interventions, analyze and address functional mobility deficits, analyze and address ROM deficits, analyze and address strength deficits, analyze and address soft tissue restrictions, analyze and cue for proper movement patterns, and analyze and modify for postural abnormalities to address functional deficits and attain remaining goals.    Progress toward goals / Updated goals:  []   See Progress Note/Recertification    Patient progressing towards goals with slight increase in shoulder ROM.  Progress exercises as patient tolerates      PLAN  YES Continue plan of care  Re-Cert Due: n/a  [x]   Upgrade activities as tolerated  []   Discharge due to :  []   Other:      Donnice Gavel, SPTA       08/28/2023       10:08 AM

## 2023-09-05 ENCOUNTER — Inpatient Hospital Stay
Admit: 2023-09-05 | Payer: MEDICARE | Attending: Rehabilitative and Restorative Service Providers" | Primary: Internal Medicine

## 2023-09-05 NOTE — Progress Notes (Signed)
PHYSICAL THERAPY - MEDICARE DAILY TREATMENT NOTE (updated 3/23)      Date: 09/05/2023          Patient Name:  Darryl Adkins DOB:  07-Jun-1951   Medical   Diagnosis:  Bilateral shoulder pain [M25.511, M25.512] Treatment Diagnosis:  M25.511  RIGHT SHOULDER PAIN and M25.512  LEFT SHOULDER PAIN    Referral Source:  Cleta Alberts, MD Insurance:   Payor: [employer] / Plan: [employer] - CHOICE PLUS / Product Type: *No Product type* /                     Patient DOB verified YES    Visit #   Current  / Total 6 24   Time   In / Out 9:55 AM 10:35 AM   Total Treatment Time 40   Total Timed Codes 40   1:1 Treatment Time 40      MC BC Totals Reminder:  bill using total billable   min of TIMED therapeutic procedures and modalities.   8-22 min = 1 unit; 23-37 min = 2 units; 38-52 min = 3 units; 53-67 min = 4 units; 68-82 min = 5 units        3TE      SUBJECTIVE    Pain Level (0-10 scale): 0/10    Any medication changes, allergies to medications, adverse drug reactions, diagnosis change, or new procedure performed?: [x]  No    []  Yes (see summary sheet for update)  Medications: Verified on Patient Summary List    Subjective functional status/changes:     Patient is much better overall at both shoulders and begun returning to a weightlifting program. He is still holding off on pressing movements and lat pull downs.    OBJECTIVE      Therapeutic Procedures:  Tx Min Billable or 1:1 Min (if diff from Tx Min) Procedure, Rationale, Specifics   40  97110 Therapeutic Exercise (timed):  increase ROM, strength, coordination, balance, and proprioception to improve patient's ability to progress to PLOF and address remaining functional goals. (see flow sheet as applicable)     Details if applicable:     40     Total Total         [x]   Patient Education billed concurrently with other procedures   [x]  Review HEP    []  Progressed/Changed HEP, detail:    []  Other detail:         Other Objective/Functional Measures      Pain Level at  end of session (0-10 scale): 0/10      Assessment     Patient will continue to benefit from skilled PT / OT services to modify and progress therapeutic interventions, analyze and address functional mobility deficits, analyze and address ROM deficits, analyze and address strength deficits, analyze and address soft tissue restrictions, analyze and cue for proper movement patterns, and analyze and modify for postural abnormalities to address functional deficits and attain remaining goals.    Progress toward goals / Updated goals:  []   See Progress Note/Recertification  Excellent progress this past week towards LTG's and advised patient to resume normal weightlifting routine except any exercise labeled shoulder, I.e deltoid raises, overhead pressing.      PLAN  YES Continue plan of care  Re-Cert Due: n/a  [x]   Upgrade activities as tolerated  []   Discharge due to :  []   Other:      Orvis Brill, PT  09/05/2023       10:07 AM

## 2023-09-11 ENCOUNTER — Inpatient Hospital Stay: Admit: 2023-09-11 | Payer: MEDICARE | Primary: Internal Medicine

## 2023-09-11 NOTE — Progress Notes (Signed)
PHYSICAL THERAPY - MEDICARE DAILY TREATMENT NOTE (updated 3/23)      Date: 09/11/2023          Patient Name:  Darryl Adkins DOB:  [birth year]   Medical   Diagnosis:  Bilateral shoulder pain [M25.511, M25.512] Treatment Diagnosis:  M25.511  RIGHT SHOULDER PAIN and M25.512  LEFT SHOULDER PAIN    Referral Source:  Cleta Alberts, MD Insurance:   Payor: [employer] MEDICARE / Plan: [facility] AARP MEDICARE ADVANTAGE / Product Type: *No Product type* /                     Patient DOB verified YES    Visit #   Current  / Total 7 24   Time   In / Out 10:45am 11:50am   Total Treatment Time 65   Total Timed Codes 50   1:1 Treatment Time 50      MC BC Totals Reminder:  bill using total billable   min of TIMED therapeutic procedures and modalities.   8-22 min = 1 unit; 23-37 min = 2 units; 38-52 min = 3 units; 53-67 min = 4 units; 68-82 min = 5 units        3TE    SUBJECTIVE    Pain Level (0-10 scale): 0/10    Any medication changes, allergies to medications, adverse drug reactions, diagnosis change, or new procedure performed?: [x]  No    []  Yes (see summary sheet for update)  Medications: Verified on Patient Summary List    Subjective functional status/changes:     Patient stated they were not in pain but was having some continued difficulties with over the head shoulder movement    OBJECTIVE      Therapeutic Procedures:  Tx Min Billable or 1:1 Min (if diff from Tx Min) Procedure, Rationale, Specifics   50  97110 Therapeutic Exercise (timed):  increase ROM, strength, coordination, balance, and proprioception to improve patient's ability to progress to PLOF and address remaining functional goals. (see flow sheet as applicable)     Details if applicable:     50     Total Total         Modalities Rationale:     decrease pain and increase tissue extensibility to improve patient's ability to progress to PLOF and address remaining functional goals.       min []  Estim Unattended,             type/location:       []   w/ice    []   w/heat        min  []  Estim Attended,             type/location:       []   w/ice   []   w/heat         []   w/US   []   TENS insruct            min []   Mechanical Traction,        type/lbs:        []   pro      []   sup           []   int       []   cont            []   before manual           []   after manual     min []   Ultrasound,  settings/location:     15 min  unbilled []   Ice     [x]   Heat            location/position: L + R Shoulder         min []   Vasopneumatic Device,      press/temp:   pre-treatment girth :    post-treatment girth :    measured at (landmark       location) :   If using vaso (only need to measure limb vaso being performed on)        min []   Other:      Skin assessment post-treatment (if applicable):    [x]   intact    []   redness- no adverse reaction                 [] redness - adverse reaction:          [x]   Patient Education billed concurrently with other procedures   [x]  Review HEP    []  Progressed/Changed HEP, detail:    []  Other detail:         Other Objective/Functional Measures  Patient tolerated exercises well    FOTO score of 65    Pain Level at end of session (0-10 scale): 0/10      Assessment     Patient will continue to benefit from skilled PT / OT services to modify and progress therapeutic interventions, analyze and address functional mobility deficits, analyze and address ROM deficits, analyze and address strength deficits, analyze and address soft tissue restrictions, analyze and cue for proper movement patterns, and analyze and modify for postural abnormalities to address functional deficits and attain remaining goals.    Progress toward goals / Updated goals:  []   See Progress Note/Recertification    Patient progressing well towards goals and is committed to HEP.  Patient is going on vacation and will schedule future visits as desired.      PLAN  YES Continue plan of care  Re-Cert Due: N/A  [x]   Upgrade activities as tolerated  []   Discharge due to :  []   Other:      Isaac Bliss, SPTA        09/11/2023       11:43 AM

## 2023-09-15 ENCOUNTER — Inpatient Hospital Stay: Payer: Medicare (Managed Care) | Attending: Gastroenterology

## 2023-09-15 MED ORDER — NORMAL SALINE FLUSH 0.9 % IV SOLN
0.9 | INTRAVENOUS | Status: DC | PRN
Start: 2023-09-15 — End: 2023-09-15

## 2023-09-15 MED ORDER — PROPOFOL 100 MG/10ML IV EMUL
100 | Freq: Once | INTRAVENOUS | Status: DC | PRN
Start: 2023-09-15 — End: 2023-09-15
  Administered 2023-09-15: 14:00:00 30 via INTRAVENOUS
  Administered 2023-09-15: 13:00:00 80 via INTRAVENOUS
  Administered 2023-09-15 (×3): 30 via INTRAVENOUS

## 2023-09-15 MED ORDER — EPHEDRINE SULFATE (PRESSORS) 50 MG/ML IV SOLN
50 | INTRAVENOUS | Status: AC
Start: 2023-09-15 — End: ?

## 2023-09-15 MED ORDER — PROPOFOL 200 MG/20ML IV EMUL
200 | INTRAVENOUS | Status: AC
Start: 2023-09-15 — End: ?

## 2023-09-15 MED ORDER — SODIUM CHLORIDE 0.9 % IV SOLN
0.9 | INTRAVENOUS | Status: DC | PRN
Start: 2023-09-15 — End: 2023-09-15

## 2023-09-15 MED ORDER — NORMAL SALINE FLUSH 0.9 % IV SOLN
0.9 | Freq: Two times a day (BID) | INTRAVENOUS | Status: DC
Start: 2023-09-15 — End: 2023-09-15

## 2023-09-15 MED ORDER — SODIUM CHLORIDE 0.9 % IV SOLN
0.9 | INTRAVENOUS | Status: DC
Start: 2023-09-15 — End: 2023-09-15
  Administered 2023-09-15: 13:00:00 via INTRAVENOUS

## 2023-09-15 MED ORDER — PHENYLEPHRINE HCL (PRESSORS) 0.4 MG/10ML IV SOSY
0.4 | INTRAVENOUS | Status: AC
Start: 2023-09-15 — End: ?

## 2023-09-15 MED ORDER — LIDOCAINE HCL (PF) 2 % IJ SOLN
2 | Freq: Once | INTRAMUSCULAR | Status: DC | PRN
Start: 2023-09-15 — End: 2023-09-15
  Administered 2023-09-15: 13:00:00 20 via INTRAVENOUS

## 2023-09-15 MED FILL — DIPRIVAN 200 MG/20ML IV EMUL: 200 MG/20ML | INTRAVENOUS | Qty: 40

## 2023-09-15 MED FILL — PHENYLEPHRINE HCL (PRESSORS) 0.4 MG/10ML IV SOSY: 0.4 MG/10ML | INTRAVENOUS | Qty: 10

## 2023-09-15 MED FILL — NORMAL SALINE FLUSH 0.9 % IV SOLN: 0.9 % | INTRAVENOUS | Qty: 40

## 2023-09-15 MED FILL — SODIUM CHLORIDE 0.9 % IV SOLN: 0.9 % | INTRAVENOUS | Qty: 1000

## 2023-09-15 MED FILL — EPHEDRINE SULFATE (PRESSORS) 50 MG/ML IV SOLN: 50 MG/ML | INTRAVENOUS | Qty: 1

## 2023-09-15 NOTE — Anesthesia Pre-Procedure Evaluation (Signed)
Department of Anesthesiology  Preprocedure Note       Name:  Darryl Adkins   Age:  73 y.o.  DOB:  08/29/1950                                          MRN:  [id number]         Date:  09/15/2023      Surgeon: Moishe Spice):  Madie Reno, MD    Procedure: Procedure(s):  COLONOSCOPY    Medications prior to admission:   Prior to Admission medications    Medication Sig Start Date End Date Taking? Authorizing Provider   MAGNESIUM CITRATE PO Take by mouth   Yes [provider]   atorvastatin (LIPITOR) 10 MG tablet Take by mouth daily   Yes Automatic Reconciliation, Ar   Coenzyme Q10 10 MG CAPS Take by mouth   Yes Automatic Reconciliation, Ar   cyanocobalamin 100 MCG tablet Take 1 tablet by mouth daily   Yes Automatic Reconciliation, Ar   empagliflozin (JARDIANCE) 10 MG tablet Take by mouth daily   Yes Automatic Reconciliation, Ar   Krill Oil (OMEGA-3) 500 MG CAPS Take by mouth   Yes Automatic Reconciliation, Ar   mesalamine (APRISO) 0.375 g extended release capsule Take 4 capsules by mouth daily   Yes Automatic Reconciliation, Ar   metFORMIN (GLUCOPHAGE) 1000 MG tablet Take 1 tablet by mouth 2 times daily (with meals)   Yes Automatic Reconciliation, Ar   Probiotic Product (ACIDOPHILUS PROBIOTIC) CAPS capsule Take by mouth   Yes Automatic Reconciliation, Ar   calcium citrate-vitamin D (CITRACAL+D) 315-5 MG-MCG TABS per tablet Take 1 tablet by mouth daily (with breakfast)    Automatic Reconciliation, Ar   clopidogrel (PLAVIX) 75 MG tablet Take by mouth    Automatic Reconciliation, Ar   glimepiride (AMARYL) 1 MG tablet Take by mouth every morning (before breakfast)    Automatic Reconciliation, Ar       Current medications:    No current facility-administered medications for this encounter.       Allergies:  No Known Allergies    Problem List:  There is no problem list on file for this patient.      Past Medical History:        Diagnosis Date    Autoimmune disease (HCC)     CAD (coronary artery disease)     Diabetes (HCC)      Hypertension        Past Surgical History:        Procedure Laterality Date    CABG, ARTERY-VEIN, FOUR  2007    6 vessel by pass    CARDIAC SURGERY      CHOLECYSTECTOMY      COLONOSCOPY N/A 08/16/2021    COLONOSCOPY performed by Madie Reno, MD at [facility] ENDOSCOPY    ORTHOPEDIC SURGERY      right toe       Social History:    Social History     Tobacco Use    Smoking status: Never    Smokeless tobacco: Never   Substance Use Topics    Alcohol use: Yes                                Counseling given: Not Answered      Vital Signs (Current):  Vitals:    09/15/23 0717   TempSrc: Temporal   Weight: 85.3 kg (188 lb)   Height: 1.803 m (5\' 11" )                                              BP Readings from Last 3 Encounters:   No data found for BP       NPO Status:                                                   Date of last liquid consumption: 09/14/23                        Date of last solid food consumption: 09/13/23    BMI:   Wt Readings from Last 3 Encounters:   09/15/23 85.3 kg (188 lb)     Body mass index is 26.22 kg/m.    CBC: No results found for: "WBC", "RBC", "HGB", "HCT", "MCV", "RDW", "PLT"    CMP: No results found for: "NA", "K", "CL", "CO2", "BUN", "CREATININE", "GFRAA", "AGRATIO", "LABGLOM", "GLUCOSE", "GLU", "CALCIUM", "BILITOT", "ALKPHOS", "AST", "ALT"    POC Tests: No results for input(s): "POCGLU", "POCNA", "POCK", "POCCL", "POCBUN", "POCHEMO", "POCHCT" in the last 72 hours.    Coags: No results found for: "PROTIME", "INR", "APTT"    HCG (If Applicable): No results found for: "PREGTESTUR", "PREGSERUM", "HCG", "HCGQUANT"     ABGs: No results found for: "PHART", "PO2ART", "PCO2ART", "HCO3ART", "BEART", "O2SATART"     Type & Screen (If Applicable):  No results found for: "ABORH", "LABANTI"    Drug/Infectious Status (If Applicable):  No results found for: "HIV", "HEPCAB"    COVID-19 Screening (If Applicable): No results found for: "COVID19"        Anesthesia Evaluation     no history of anesthetic  complications:   Airway: Mallampati: III  TM distance: >3 FB     Mouth opening: < 3 FB   Dental: normal exam         Pulmonary:normal exam        (-) COPD and asthma                           Cardiovascular:  Exercise tolerance: good (>4 METS)  (+) hypertension:, CAD:, CABG/stent:    (-)  angina and  DOE        Rate: normal                    Neuro/Psych:      (-) seizures and CVA           GI/Hepatic/Renal:            ROS comment: Patient is appropriately NPO  .   Endo/Other:    (+) Diabetes.                 Abdominal:             Vascular:          Other Findings:             Anesthesia Plan      MAC     ASA  3             Anesthetic plan and risks discussed with patient.                        Osie Cheeks, MD   09/15/2023

## 2023-09-15 NOTE — H&P (Signed)
[facility]  [address]  [place], [place] [address]  [phone]                                History and Physical     NAME: Darryl Adkins   DOB:  11/28/1950   MRN:  [id number]     HPI:  The patient was seen and examined.    Past Surgical History:   Procedure Laterality Date    CABG, ARTERY-VEIN, FOUR  2007    6 vessel by pass    CARDIAC SURGERY      CHOLECYSTECTOMY      COLONOSCOPY N/A 08/16/2021    COLONOSCOPY performed by Madie Reno, MD at [facility] ENDOSCOPY    ORTHOPEDIC SURGERY      right toe     Past Medical History:   Diagnosis Date    Autoimmune disease (HCC)     CAD (coronary artery disease)     Diabetes (HCC)     Hypertension      Social History     Tobacco Use    Smoking status: Never    Smokeless tobacco: Never   Substance Use Topics    Alcohol use: Yes    Drug use: Never     No Known Allergies  History reviewed. No pertinent family history.  Current Facility-Administered Medications   Medication Dose Route Frequency    0.9 % sodium chloride infusion   IntraVENous Continuous    sodium chloride flush 0.9 % injection 5-40 mL  5-40 mL IntraVENous 2 times per day    sodium chloride flush 0.9 % injection 5-40 mL  5-40 mL IntraVENous PRN    0.9 % sodium chloride infusion   IntraVENous PRN         PHYSICAL EXAM:  General: WD, WN. Alert, cooperative, no acute distress    HEENT: NC, Atraumatic.  PERRLA, EOMI. Anicteric sclerae.  Lungs:  CTA Bilaterally. No Wheezing/Rhonchi/Rales.  Heart:  Regular  rhythm,  No murmur, No Rubs, No Gallops  Abdomen: Soft, Non distended, Non tender.  +Bowel sounds, no HSM  Extremities: No c/c/e  Neurologic:  CN 2-12 gi, Alert and oriented X 3.  No acute neurological distress   Psych:   Good insight. Not anxious nor agitated.    The heart, lungs and mental status were satisfactory for the administration of mac sedation and for the procedure.      Mallampati score: 2     The patient was counseled at length about the risks of contracting Covid-19 in the  peri-operative and post-operative states including the recovery window of their procedure.  The patient was made aware that contracting Covid-19 after a surgical procedure may worsen their prognosis for recovering from the virus and lend to a higher morbidity and or mortality risk.  The patient was given the options of postponing their procedure. All of the risks, benefits, and alternatives were discussed. The patient does wish to proceed with the procedure.      Assessment:   UC    Plan:   Endoscopic procedure  MAC sedation

## 2023-09-15 NOTE — Progress Notes (Signed)
 Endoscopy recovery  Patient returned to baseline, vital signs stable (see vital sign flowsheet). Patient offered liquids and tolerated well. Respiratory status within defined limits. Abdomen soft not tender. Skin with in defined limits. Responsible party driving patient home was given the opportunity to ask questions. Patient discharged with documented belongings.

## 2023-09-15 NOTE — Op Note (Signed)
[facility]  [address]  [place], [place] [address]  [phone]                              Colonoscopy Procedure Note      Indications:  Long standing ulcerative colitis     Operator:  Collene Leyden, MD    Staff: Circulator: Barnie Del, RN  Endoscopy Technician: Gerald Dexter    Referring Provider: Kristine Royal, MD    Sedation:  MAC    Procedure Details:  After informed consent was obtained with all risks and benefits of procedure explained and preoperative exam completed, the patient was taken to the endoscopy suite and placed in the left lateral decubitus position.  Upon sequential sedation as per above, a digital rectal exam was performed per below.  The Olympus videocolonoscope was inserted in the rectum and carefully advanced to the ileum.  The quality of preparation was good.  Boston Bowel Prep Score : 3/3/3. The colonoscope was slowly withdrawn with careful evaluation between folds. Retroflexion in the rectum was performed.     Findings:   Rectum: normal mucosa with subtle scarring from remote inflammation, small rectal vault - biopsies obtained   Sigmoid: normal mucosa - biopsied   Descending Colon: normal mucosa - biopsied   Transverse Colon: normal mucosa - biopsied   Ascending Colon: normal mucosa - biopsied   Cecum: normal mucosa   Terminal Ileum:  normal mucosa     Interventions:  bx    Specimen Removed:    ID Type Source Tests Collected by Time Destination   1 : Ascending colon biopsy Tissue Colon-Ascending SURGICAL PATHOLOGY Madie Reno, MD 09/15/2023 [phone]    2 : descending colon biopsy Tissue Colon-Descending SURGICAL PATHOLOGY Madie Reno, MD 09/15/2023 [phone]    3 : rectum biopsy Tissue Rectum SURGICAL PATHOLOGY Madie Reno, MD 09/15/2023 [phone]        Complications: None.     EBL:  minimal     Impression:    See Postoperative diagnosis above    Recommendations:   - Await pathology. You should receive a letter within 2 weeks.   - Resume normal  medications. Restart plavix in 48 hours.   - Recommend repeat colonoscopy in 2 years.     Discharge Disposition:  Home in the company of a driver when able to ambulate.    Collene Leyden, MD  09/15/2023  8:53 AM

## 2023-09-15 NOTE — Discharge Instructions (Signed)
[facility]  [phone]                                  Avigdor Wroble  [id number]  1951-02-24    It was my pleasure seeing you for your procedure.  You will also receive a summary report with the findings from this procedure and any further recommendations.  If you had polyps removed or biopsies taken during your procedure, you will receive a separate letter from me within approximately the next 2 weeks.  If you don't receive this letter or if you have any questions, please call my office [phone].     Please take note of the post procedure instructions listed below.    Best Wishes,    Dr. Charlott Holler      CARE FOLLOWING YOUR PROCEDURE    These instructions give you information on caring for yourself after your procedure. Call your doctor if you have any problems or questions after your procedure.    HOME CARE  Walk if you have belly cramping or gas.  Walking will help get rid of the air and reduce the bloated feeling in your belly (abdomen).  Your IV site (where you received drugs) may be tender to touch.  Place warm towels on the site; keep your arm up on two pillows if you have any swelling or soreness in the area.  You may shower.    ACTIVITY:  Take frequent rest periods and move at a slower pace for the next 24 hours..  You may resume your regular activity tomorrow if you are feeling back to normal.  Do not drive or ride a bicycle for at least 24 hours (because of the medicine (anesthesia) used during the test).  Do not sign any important legal documents or use or operate any machinery for 24 hours  Do not take sleeping medicines/nerve drugs for 24 hours unless the doctor tells you.  You can return to work/school tomorrow unless otherwise instructed.    NUTRITION:  Drink plenty of fluids to keep your pee (urine) clear or pale yellow  Begin with a light meal and progress to your normal diet. Heavy or fried foods are harder to digest and may make you feel sick to your stomach  (nauseated).  Once you are feeling back to normal, you may resume your normal diet as instructed by your doctor.  Avoid alcoholic beverages for 24 hours or as instructed.    IF YOU HAD BIOPSIES TAKEN OR POLYPS REMOVED DURING THE PROCEDURE:  For the next 7 days, avoid all non-steroidal antiinflammatory medications such as Ibuprofen, Motrin, Advil, Alleve, Alka-seltzer, Goody's powder, BC powder.  If you do not have an heart condition that requires you to take a daily aspirin, you should avoid taking aspirin for 7 days.  Eat a soft diet for 24 hours.  Monitor your stools for any blood or dark black, tar-like, stools as this may be a sign of bleeding and if you see any blood, notify your doctor immediately.    GET HELP RIGHT AWAY AND SEEK IMMEDIATE MEDICAL CARE IF:  You have more than a spotting of blood in your stool.  You pass clumps of tissue (blood clots) or fill the toilet with blood.  Your belly is painfully swollen or puffy (abdominal distention).  You throw up (vomit).  You have a fever.  You have redness, pain or swelling at the IV  site that last greater than two days.  You have abdominal pain or discomfort that is severe or gets worse throughout the day.    Post-procedure recommendations:   Findings:   Rectum: normal mucosa with subtle scarring from remote inflammation, small rectal vault - biopsies obtained   Sigmoid: normal mucosa - biopsied   Descending Colon: normal mucosa - biopsied   Transverse Colon: normal mucosa - biopsied   Ascending Colon: normal mucosa - biopsied   Cecum: normal mucosa   Terminal Ileum:  normal mucosa     Recommendations:   - Await pathology. You should receive a letter within 2 weeks.   - Resume normal medications. Restart plavix in 48 hours.   - Recommend repeat colonoscopy in 2 years.

## 2023-09-15 NOTE — Anesthesia Post-Procedure Evaluation (Signed)
Department of Anesthesiology  Postprocedure Note    Patient: Darryl Adkins  MRN: [id number]  Birthdate: 07/20/1951  Date of evaluation: 09/15/2023    Procedure Summary       Date: 09/15/23 Room / Location: [facility] ENDO 04 / [facility] ENDOSCOPY    Anesthesia Start: 0820 Anesthesia Stop: 0849    Procedure: COLONOSCOPY (Lower GI Region) Diagnosis:       Fall, sequela      Incontinence of feces, unspecified fecal incontinence type      Encounter for long-term use of antiplatelets/antithrombotics      Lower abdominal pain      Ulcerative colitis without complications, unspecified location [facility])      (Fall, sequela [W19.XXXS])      (Incontinence of feces, unspecified fecal incontinence type [R15.9])      (Encounter for long-term use of antiplatelets/antithrombotics [Z79.02])      (Lower abdominal pain [R10.30])      (Ulcerative colitis without complications, unspecified location (HCC) [K51.90])    Surgeons: Madie Reno, MD Responsible Provider: Osie Cheeks, MD    Anesthesia Type: MAC ASA Status: 3            Anesthesia Type: MAC    Aldrete Phase I: Aldrete Score: 10    Aldrete Phase II: Aldrete Score: 9    Anesthesia Post Evaluation    No notable events documented.

## 2024-05-06 ENCOUNTER — Encounter
Payer: Medicare (Managed Care) | Attending: Rehabilitative and Restorative Service Providers" | Primary: Internal Medicine

## 2024-05-26 ENCOUNTER — Inpatient Hospital Stay: Admit: 2024-05-26 | Payer: Medicare (Managed Care) | Primary: Internal Medicine

## 2024-05-26 DIAGNOSIS — M542 Cervicalgia: Principal | ICD-10-CM

## 2024-05-26 NOTE — Other (Cosign Needed)
 Physical Therapy at [facility],   a part of [facility]  [address] [place], [address]  [place], [place]  [address]  Phone: [phone]  Fax: [phone]       PHYSICAL THERAPY - MEDICARE EVALUATION/PLAN OF CARE NOTE (updated 3/23)      Date: 05/26/2024          Patient Name:  Darryl Adkins DOB:  1950-10-14   Medical   Diagnosis:  Cervicalgia [M54.2] Treatment Diagnosis:  M54.2  NECK PAIN    Referral Source:  Lisette Josette RIGGERS Provider #:  [id number]                Insurance: Payor: UHC MEDICARE / Plan: [facility] AARP MEDICARE ADVANTAGE / Product Type: *No Product type* /      Patient DOB verified yes     Visit #   Current  / Total 1 16   Time   In / Out 1150a 1240p   Total Treatment Time 50   Total Timed Codes 25   1:1 Treatment Time 25    [facility] BC Totals Reminder:  bill using total billable   min of TIMED therapeutic procedures and modalities.   8-22 min = 1 unit; 23-37 min = 2 units; 38-52 min = 3 units;  53-67 min = 4 units; 68-82 min = 5 units           SUBJECTIVE  If an interpreting service was utilized for treatment of this patient, the contents of this document represent the material reviewed with the patient via the interpreter.     Pain Level (0-10 scale): 1-5  [] constant [] intermittent [] improving [] worsening [] no change since onset    Any medication changes, allergies to medications, adverse drug reactions, diagnosis change, or new procedure performed?: [x]  No    []  Yes (see summary sheet for update)  Medications: Verified on Patient Summary List    Subjective functional status/changes:     Patient reports the pain started at the beginning of the summer when he just woke up one day with pain on the left side of his neck. He notes popping and cracking with turning his head, he has tried yoga to stretch his neck out but is concerned over how noisy his neck is.  He notes looking to the left while driving and taking his left ear to the shoulder is more painful.  He reports  playing golf and pickle ball does not make his neck worse, but he has trouble finding a comfortable sleeping position.      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  Anterior endplate osteophytes noted C3-4, C5-7.  No acute   fracture or dislocation.  Moderate facet degenerative changes.     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 Situation: lives with wife in daughter and son-in-law's house  Mobility: Independent  Self Care: Independent  Previous Treatment/Compliance: PT at this facility for shoulder pain  PMHx/Surgical Hx/Comorbidites:   Past Medical History:  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  No date: CAD (coronary artery disease)  No date: Diabetes (HCC)  No date: Hypertension  Current Outpatient Medications on File Prior to Encounter   Medication Sig  Dispense Refill    MAGNESIUM CITRATE PO Take by mouth      atorvastatin (LIPITOR) 10 MG tablet Take by mouth daily      calcium citrate-vitamin D (CITRACAL+D) 315-5 MG-MCG TABS per tablet Take 1 tablet by mouth daily (with breakfast)      clopidogrel (PLAVIX) 75 MG tablet Take by mouth      Coenzyme Q10 10 MG CAPS Take by mouth      cyanocobalamin 100 MCG tablet Take 1 tablet by mouth daily      empagliflozin (JARDIANCE) 10 MG tablet Take by mouth daily      glimepiride (AMARYL) 1 MG tablet Take by mouth every morning (before breakfast)      Krill Oil (OMEGA-3) 500 MG CAPS Take by mouth      mesalamine (APRISO) 0.375 g extended release capsule Take 4 capsules by mouth daily      metFORMIN (GLUCOPHAGE) 1000 MG tablet Take 1 tablet by mouth 2 times daily (with meals)      Probiotic Product (ACIDOPHILUS PROBIOTIC) CAPS  capsule Take by mouth       No current facility-administered medications on file prior to encounter.       Prior Hospitalization:  cardiac bypass surgery 2007, cardiac stent 2018  Barriers: [] pain [] Financial [] time [] transportation [] Other:  Substance use: [] Alcohol [] Tobacco [] other:   Pt Goals: improve pain with sleep and turning his head to drive, improve mobility  Motivation: High  Cognition: A & O x 4             OBJECTIVE    Posture:  forward head, thoracic kyphosis  Gait and Functional Mobility:  WNL  Palpation: TTP at L cervical paraspinals, suboccipitals  Joint Mobility: reduced left unilateral P-A and side glide at C3-C7    Cervical AROM:    FLEX: 55 deg *stretch  EXT: 41 deg P!  R SB: 18 deg L-side stretch  L SB: 25 deg P!  R ROT: 61 deg *stretch  L ROT: 64 deg P!     UPPER QUARTER   MUSCLE STRENGTH  KEY       R  L  0 - No Contraction  C1, C2 Neck Flex 5  5  1  - Trace   C3 Side Flex  5  5  2  - Poor   C4 Sh Elev  5  5  3  - Fair    C5 Deltoid/Biceps 5  5  4  - Good   C6 Wrist Ext  5  5  5  - Normal   C7 Triceps  5  5      C8 Thumb Ext  5  5      T1 Hand Inst  5  5          MMT:       R    L   Shoulder Flexion      4+/5    4+/5   Shoulder Abduction   5/5    5/5   Shoulder External Rotation  4/5    4/5   Shoulder Internal Rotation  4+/5    4/5   Grip     WNL    WNL    Neurological: Reflexes / Sensations: DTR 2+ at bilateral biceps and triceps, sensation intact to LT at bilateral UE    Special Tests:    Spurling's (L: - R: -)       Repeated Flexion: no change in symptoms  Repeated Extension: worsens symptoms      Cervical Distraction: P! relief      Cervical Flexion-Rotation Test (L: + R: -)      Objective/Functional Outcome Measure:  FOTO Score: 58  FOTO score = an established functional score where 100 = no disability      25 min [x] Eval - untimed                        Therapeutic Procedures:  Tx Min Billable or 1:1 Min (if diff from Tx Min) Procedure, Rationale, Specifics   15  97110 Therapeutic  Exercise (timed):  increase ROM, strength, coordination, balance, and proprioception to improve patient's ability to progress to PLOF and address remaining functional goals. (see flow sheet as applicable)    Details if applicable:     10  97535 Self Care/Home Management (timed):  improve patient knowledge and understanding of home injury/symptom/pain management, positioning, posture/ergonomics, home safety, activity modification, and joint protection strategies  to improve patient's ability to progress to PLOF and address remaining functional goals.  (see flow sheet as applicable)    Details if applicable:     25     Total Total       [x]   Patient Education billed concurrently with other procedures   [x]  Review HEP    []  Progressed/Changed HEP, detail:    []  Other detail:         Pain Level at end of session (0-10 scale): 2    Plan of Care / Statement of Necessity for Physical Therapy Services     Assessment / key information:    Mr. Gentile is referred to skilled therapy services for treatment of neck pain. Patient is presenting with impaired AROM, endurance, and positional tolerance.  The patient demonstrates limitations with ADL that include driving, reading, and discomfort during sleep.  Their current condition is complicated by the presence of osteoarthritis.  The patient will benefit from skilled PT treatment to improve limitations with ADL and reduce pain with return to PLOF.       Evaluation Complexity:  History:  MEDIUM  Complexity : 1-2 comorbidities / personal factors will impact the outcome/ POC ; Examination:  LOW Complexity : 1-2 Standardized tests and measures addressing body structure, function, activity limitation and / or participation in recreation  ;Presentation:  LOW Complexity : Stable, uncomplicated  ;Clinical Decision Making:  MEDIUM Complexity : FOTO score of 26-74 Overall Complexity Rating: LOW   Problem List: pain affecting function, decrease ROM, decrease ADL/functional abilities, decrease  activity tolerance, decrease flexibility/joint mobility, and decrease transfer abilities    Treatment Plan may include any combination of the following: 02889 Therapeutic Exercise, 97112 Neuromuscular Re-Education, 97140 Manual Therapy, 97530 Therapeutic Activity, 97535 Self Care/Home Management, 97014 Electrical Stim unattended / [id number] [facility]), 97016 Vasopneumatic Device  (Vasopnuematic compression justification:  Per bilateral girth measures taken and listed above the edema is considered significant and having an impact on the patient's transfers, self care, and ADL's), 97012 Mechanical Traction, and (Elective Self Pay) Needle Insertion w/o Injection (1 or 2 muscles), (3+ muscles)  Patient / Family readiness to learn indicated by: asking questions, trying to perform skills, interest, return verbalization , and return demonstration   Persons(s) to be included in education: patient (P)  Barriers to Learning/Limitations: none  Measures taken if barriers to learning present: none  Patient Self Reported Health Status: good  Rehabilitation Potential: good    Short Term Goals: To be accomplished  in 4 treatments.  Patient will be independent with initial HEP in order to transition to general wellness program.  Patient will demonstrate cervical AROM ROT to a minimum of 65 degrees bilaterally to allow for a minimum of 10 minutes of driving with less than 7/89 pain.  Patient will report worst pain level of 2/10 or better to allow for a minimum of 8 hours of interrupted sleeping ability.  The patient will demonstrate cervical flexion AROM to 60 degrees or greater to improve ease in reading in home.  The patient will demonstrate cervical extension AROM to 45 degrees or greater to improve ease will swallowing a pill.     Long Term Goals: To be accomplished in 16 treatments.  Patient will report worst pain no greater than 2/10 to increase QOL and allow for independence with all hygienic self-care and ADL skills.  Patient will  demonstrate full cervical AROM WFL to allow for return to driving for over an hour without limitations.   Patient will demonstrate 5/5 BUE strength to allow for home cleaning skills independently and without rest breaks needed.  Patient will demonstrate pain free cervical AROM WFL to improve ease with household chores like emptying the dishwasher.      Frequency / Duration: Patient to be seen 1 times per week for 16 treatments.    Patient/ Caregiver education and instruction: Diagnosis, prognosis, self care, activity modification, and exercises   [x]   Plan of care has been reviewed with PTA      Certification Period: 05/26/2024 - 08/24/24      Prentice Sous, PT  , DPT, Cert. DN, CRGA Level II       05/26/2024       11:50 AM        ===================================================================  I certify that the above Therapy Services are being furnished while the patient is under my care. I agree with the treatment plan and certify that this therapy is necessary.    Physician's Signature:_________________________   DATE:_________   TIME:________                           Lisette Neptune, PA-C    ** Signature, Date and Time must be completed for valid certification **  Please sign and fax to [phone].  Thank you

## 2024-06-02 ENCOUNTER — Inpatient Hospital Stay: Admit: 2024-06-02 | Payer: Medicare (Managed Care) | Primary: Internal Medicine

## 2024-06-02 NOTE — Progress Notes (Addendum)
 "  PHYSICAL THERAPY - MEDICARE DAILY TREATMENT NOTE (updated 3/23)      Date: 06/02/2024          Patient Name:  Darryl Adkins DOB:  March 18, 1951   Medical   Diagnosis:  Cervicalgia [M54.2] Treatment Diagnosis:  M54.2  NECK PAIN    Referral Source:  Lisette Neptune, PA-C Insurance:   Payor: UHC MEDICARE / Plan: [facility] AARP MEDICARE ADVANTAGE / Product Type: *No Product type* /                     Patient DOB verified yes     Visit #   Current  / Total 2 16   Time   In / Out 8:45 am 9:35 am   Total Treatment Time 50   Total Timed Codes 40   1:1 Treatment Time 40      [facility] BC Totals Reminder:  bill using total billable   min of TIMED therapeutic procedures and modalities.   8-22 min = 1 unit; 23-37 min = 2 units; 38-52 min = 3 units; 53-67 min = 4 units; 68-82 min = 5 units            SUBJECTIVE  If an interpreting service was utilized for treatment of this patient, the contents of this document represent the material reviewed with the patient via the interpreter.     Pain Level (0-10 scale): 4    Any medication changes, allergies to medications, adverse drug reactions, diagnosis change, or new procedure performed?: [x]  No    []  Yes (see summary sheet for update)  Medications: Verified on Patient Summary List    Subjective functional status/changes:     Pt reporting that he tripped 3 days ago ("Sunday 10/5) and fell onto his R side. Pt reporting abrasion to the R elbow but denies head impact or worsening neck pain following, just overall very stiff today.     OBJECTIVE      Therapeutic Procedures:  Tx Min Billable or 1:1 Min (if diff from Tx Min) Procedure, Rationale, Specifics   25  97110 Therapeutic Exercise (timed):  increase ROM, strength, coordination, balance, and proprioception to improve patient's ability to progress to PLOF and address remaining functional goals. (see flow sheet as applicable)     Details if applicable:     15  97140 Manual Therapy (timed):  decrease pain, increase ROM, and increase tissue extensibility to  improve patient's ability to progress to PLOF and address remaining functional goals.  The manual therapy interventions were performed at a separate and distinct time from the therapeutic activities interventions . (see flow sheet as applicable)     Details if applicable:  Gentle cervical distraction, passive L UT and LS stretching, SOR, STM to L UT/LS         Details if applicable:           Details if applicable:            Details if applicable:     40"      Total Total     Modalities Rationale:     decrease pain and increase tissue extensibility to improve patient's ability to progress to PLOF and address remaining functional goals.       min []  Estim Unattended,             type/location:       []   w/ice    []   w/heat        min []   Estim Attended,             type/location:       []   w/ice   []   w/heat         []   w/US    []   TENS insruct            min []   Mechanical Traction,        type/lbs:        []   pro      []   sup           []   int       []   cont            []   before manual           []   after manual     min []   Ultrasound,         settings/location:     10 min  unbilled []   Ice     [x]   Heat            location/position: cervical and B shoulders / Seated         min []   Vasopneumatic Device,      press/temp:   pre-treatment girth :    post-treatment girth :    measured at (landmark       location) :   If using vaso (only need to measure limb vaso being performed on)        min []   Other:      Skin assessment post-treatment (if applicable):    [x]   intact    []   redness- no adverse reaction                 [] redness - adverse reaction:          [x]   Patient Education billed concurrently with other procedures   [x]  Review HEP    []  Progressed/Changed HEP, detail:    []  Other detail:         Other Objective/Functional Measures      Pain Level at end of session (0-10 scale): better      Assessment   Pt with good tolerance for all interventions today. Held on exercise progressions due to patient report of  recent fall. Pt may benefit from addition of balance exercises next visit in order to address fall risk and prevent future falls.   Patient will continue to benefit from skilled PT / OT services to modify and progress therapeutic interventions, analyze and address functional mobility deficits, analyze and address ROM deficits, analyze and address strength deficits, analyze and address soft tissue restrictions, analyze and cue for proper movement patterns, analyze and modify for postural abnormalities, and analyze and address imbalance/dizziness to address functional deficits and attain remaining goals.    Progress toward goals / Updated goals:  []   See Progress Note/Recertification    No significant progress made on today's visit but pt noted improve cervical pain following MT. Plan to progress POC next if able.       PLAN  Yes  Continue plan of care  Re-Cert Due: 87/69/74   [x]   Upgrade activities as tolerated  []   Discharge due to:  []   Other:      Norman Lesches, PTA       06/02/2024       8:49 AM              "

## 2024-06-09 ENCOUNTER — Inpatient Hospital Stay: Admit: 2024-06-09 | Payer: Medicare (Managed Care) | Primary: Internal Medicine

## 2024-06-09 NOTE — Progress Notes (Addendum)
 "  PHYSICAL THERAPY - MEDICARE DAILY TREATMENT NOTE (updated 3/23)      Date: 06/09/2024          Patient Name:  Darryl Adkins DOB:  Nov 18, 1950   Medical   Diagnosis:  Cervicalgia [M54.2] Treatment Diagnosis:  M54.2  NECK PAIN    Referral Source:  Lisette Neptune, PA-C Insurance:   Payor: UHC MEDICARE / Plan: [facility] AARP MEDICARE ADVANTAGE / Product Type: *No Product type* /                     Patient DOB verified yes     Visit #   Current  / Total 3 16   Time   In / Out 8:45 am 9:35 am   Total Treatment Time 50   Total Timed Codes 40   1:1 Treatment Time 40      MC BC Totals Reminder:  bill using total billable   min of TIMED therapeutic procedures and modalities.   8-22 min = 1 unit; 23-37 min = 2 units; 38-52 min = 3 units; 53-67 min = 4 units; 68-82 min = 5 units            SUBJECTIVE  If an interpreting service was utilized for treatment of this patient, the contents of this document represent the material reviewed with the patient via the interpreter.     Pain Level (0-10 scale): 2-3    Any medication changes, allergies to medications, adverse drug reactions, diagnosis change, or new procedure performed?: [x]  No    []  Yes (see summary sheet for update)  Medications: Verified on Patient Summary List    Subjective functional status/changes:     Pt reporting he felt okay after his last visit. There are days I feel it, and then there are days I don't I am very concerned over my balance, it is the reason I tripped and fell last week    OBJECTIVE      Therapeutic Procedures:  Tx Min Billable or 1:1 Min (if diff from Tx Min) Procedure, Rationale, Specifics   23  97110 Therapeutic Exercise (timed):  increase ROM, strength, coordination, balance, and proprioception to improve patient's ability to progress to PLOF and address remaining functional goals. (see flow sheet as applicable)     Details if applicable:     15  97140 Manual Therapy (timed):  decrease pain, increase ROM, and increase tissue extensibility to improve  patient's ability to progress to PLOF and address remaining functional goals.  The manual therapy interventions were performed at a separate and distinct time from the therapeutic activities interventions . (see flow sheet as applicable)     Details if applicable:  Gentle cervical distraction, passive L UT and LS stretching, SOR, STM to L UT/LS   2  97112 Neuromuscular Re-Education (timed):  improve balance, coordination, kinesthetic sense, posture, core stability and proprioception to improve patient's ability to develop conscious control of individual muscles and awareness of position of extremities in order to progress to PLOF and address remaining functional goals. (see flow sheet as applicable)    Details if applicable:           Details if applicable:            Details if applicable:     40     Total Total     Modalities Rationale:     decrease pain and increase tissue extensibility to improve patient's ability to progress to PLOF and address remaining  functional goals.       min []  Estim Unattended,             type/location:       []   w/ice    []   w/heat        min []  Estim Attended,             type/location:       []   w/ice   []   w/heat         []   w/US    []   TENS insruct            min []   Mechanical Traction,        type/lbs:        []   pro      []   sup           []   int       []   cont            []   before manual           []   after manual     min []   Ultrasound,         settings/location:     10 min  unbilled []   Ice     [x]   Heat            location/position: cervical and B shoulders / Seated         min []   Vasopneumatic Device,      press/temp:   pre-treatment girth :    post-treatment girth :    measured at (landmark       location) :   If using vaso (only need to measure limb vaso being performed on)        min []   Other:      Skin assessment post-treatment (if applicable):    [x]   intact    []   redness- no adverse reaction                 [] redness - adverse reaction:          [x]   Patient  Education billed concurrently with other procedures   [x]  Review HEP    []  Progressed/Changed HEP, detail:    []  Other detail:         Other Objective/Functional Measures  SLS: R 30s L 6s  Tandem Balance: 30s B, mod sway  EC Balance HTI Stance: R Leading 16s  L Leading 26 s, mod sway B    Pain Level at end of session (0-10 scale): better      Assessment   Pt with good tolerance for all interventions today. Pt tolerated additional postural strengthening and introductory balance exercises today without an increase in pain. Updated HEP.  Patient will continue to benefit from skilled PT / OT services to modify and progress therapeutic interventions, analyze and address functional mobility deficits, analyze and address ROM deficits, analyze and address strength deficits, analyze and address soft tissue restrictions, analyze and cue for proper movement patterns, analyze and modify for postural abnormalities, and analyze and address imbalance/dizziness to address functional deficits and attain remaining goals.    Progress toward goals / Updated goals:  []   See Progress Note/Recertification    Short Term Goals: To be accomplished in 4 treatments.  Patient will be independent with initial HEP in order to transition to general wellness program.  Patient will demonstrate cervical AROM ROT to a minimum of 65 degrees bilaterally  to allow for a minimum of 10 minutes of driving with less than 7/89 pain.  Patient will report worst pain level of 2/10 or better to allow for a minimum of 8 hours of interrupted sleeping ability.  The patient will demonstrate cervical flexion AROM to 60 degrees or greater to improve ease in reading in home.  The patient will demonstrate cervical extension AROM to 45 degrees or greater to improve ease will swallowing a pill.      Long Term Goals: To be accomplished in 16 treatments.  Patient will report worst pain no greater than 2/10 to increase QOL and allow for independence with all hygienic  self-care and ADL skills.  Patient will demonstrate full cervical AROM WFL to allow for return to driving for over an hour without limitations.   Patient will demonstrate 5/5 BUE strength to allow for home cleaning skills independently and without rest breaks needed.  Patient will demonstrate pain free cervical AROM WFL to improve ease with household chores like emptying the dishwasher.      PLAN  Yes  Continue plan of care  Re-Cert Due: 87/69/74   [x]   Upgrade activities as tolerated  []   Discharge due to:  []   Other:      Norman Lesches, PTA       06/09/2024       8:49 AM              "

## 2024-06-15 ENCOUNTER — Inpatient Hospital Stay: Admit: 2024-06-15 | Payer: Medicare (Managed Care) | Primary: Internal Medicine

## 2024-06-15 NOTE — Progress Notes (Signed)
 "  PHYSICAL THERAPY - MEDICARE DAILY TREATMENT NOTE (updated 3/23)      Date: 06/15/2024          Patient Name:  Darryl Adkins DOB:  05-Sep-1950   Medical   Diagnosis:  Cervicalgia [M54.2] Treatment Diagnosis:  M54.2  NECK PAIN    Referral Source:  Lisette Neptune, PA-C Insurance:   Payor: UHC MEDICARE / Plan: [facility] AARP MEDICARE ADVANTAGE / Product Type: *No Product type* /                     Patient DOB verified yes     Visit #   Current  / Total 5 16   Time   In / Out 5:35 pm 6:25 pm   Total Treatment Time 50   Total Timed Codes 40   1:1 Treatment Time 40      [facility] Totals Reminder:  bill using total billable   min of TIMED therapeutic procedures and modalities.   8-22 min = 1 unit; 23-37 min = 2 units; 38-52 min = 3 units; 53-67 min = 4 units; 68-82 min = 5 units            SUBJECTIVE  If an interpreting service was utilized for treatment of this patient, the contents of this document represent the material reviewed with the patient via the interpreter.     Pain Level (0-10 scale): 3    Any medication changes, allergies to medications, adverse drug reactions, diagnosis change, or new procedure performed?: [x]  No    []  Yes (see summary sheet for update)  Medications: Verified on Patient Summary List    Subjective functional status/changes:     Pt reports history of falling with stepping into gutters or tripping over objects in the environment.  He wants to work on his balance as well as his neck pain.    OBJECTIVE      Therapeutic Procedures:  Tx Min Billable or 1:1 Min (if diff from Tx Min) Procedure, Rationale, Specifics   15  97110 Therapeutic Exercise (timed):  increase ROM, strength, coordination, balance, and proprioception to improve patient's ability to progress to PLOF and address remaining functional goals. (see flow sheet as applicable)     Details if applicable:     15  97140 Manual Therapy (timed):  decrease pain, increase ROM, and increase tissue extensibility to improve patient's ability to progress to  PLOF and address remaining functional goals.  The manual therapy interventions were performed at a separate and distinct time from the therapeutic activities interventions . (see flow sheet as applicable)     Details if applicable:  Gentle cervical distraction, passive L UT and LS stretching, SOR, STM to L UT/LS   10  97112 Neuromuscular Re-Education (timed):  improve balance, coordination, kinesthetic sense, posture, core stability and proprioception to improve patient's ability to develop conscious control of individual muscles and awareness of position of extremities in order to progress to PLOF and address remaining functional goals. (see flow sheet as applicable)    Details if applicable:           Details if applicable:            Details if applicable:     40     Total Total     Modalities Rationale:     decrease pain and increase tissue extensibility to improve patient's ability to progress to PLOF and address remaining functional goals.       min []   Estim Unattended,             type/location:       []   w/ice    []   w/heat        min []  Estim Attended,             type/location:       []   w/ice   []   w/heat         []   w/US    []   TENS insruct            min []   Mechanical Traction,        type/lbs:        []   pro      []   sup           []   int       []   cont            []   before manual           []   after manual     min []   Ultrasound,         settings/location:     10 min  unbilled []   Ice     [x]   Heat            location/position: cervical and B shoulders / Seated         min []   Vasopneumatic Device,      press/temp:   pre-treatment girth :    post-treatment girth :    measured at (landmark       location) :   If using vaso (only need to measure limb vaso being performed on)        min []   Other:      Skin assessment post-treatment (if applicable):    [x]   intact    []   redness- no adverse reaction                 [] redness - adverse reaction:          [x]   Patient Education billed concurrently with  other procedures   [x]  Review HEP    []  Progressed/Changed HEP, detail:    []  Other detail:         Other Objective/Functional Measures  Cervical AROM:                       FLEX: 60 deg   EXT: 41 deg L-side P!  R SB: 20 deg  L SB: 28 deg P!  R ROT: 62 deg *stretch  L ROT: 66 deg       SLS: R 30s L15s  Tandem Balance: 30s B, mod sway    Pain Level at end of session (0-10 scale): 2      Assessment   Pt reports good compliance with home exercises, he was asked to perform them every other day.  He notes less pain at his neck and improving static balance with new home exercises.    Patient will continue to benefit from skilled PT / OT services to modify and progress therapeutic interventions, analyze and address functional mobility deficits, analyze and address ROM deficits, analyze and address strength deficits, analyze and address soft tissue restrictions, analyze and cue for proper movement patterns, analyze and modify for postural abnormalities, and analyze and address imbalance/dizziness to address functional deficits and attain remaining goals.    Progress toward goals / Updated goals:  []   See  Progress Note/Recertification    Short Term Goals: To be accomplished in 4 treatments.  Patient will be independent with initial HEP in order to transition to general wellness program.  Patient will demonstrate cervical AROM ROT to a minimum of 65 degrees bilaterally to allow for a minimum of 10 minutes of driving with less than 7/89 pain.  Patient will report worst pain level of 2/10 or better to allow for a minimum of 8 hours of interrupted sleeping ability.  The patient will demonstrate cervical flexion AROM to 60 degrees or greater to improve ease in reading in home.  The patient will demonstrate cervical extension AROM to 45 degrees or greater to improve ease will swallowing a pill.      Long Term Goals: To be accomplished in 16 treatments.  Patient will report worst pain no greater than 2/10 to increase QOL and allow  for independence with all hygienic self-care and ADL skills.  Patient will demonstrate full cervical AROM WFL to allow for return to driving for over an hour without limitations.   Patient will demonstrate 5/5 BUE strength to allow for home cleaning skills independently and without rest breaks needed.  Patient will demonstrate pain free cervical AROM WFL to improve ease with household chores like emptying the dishwasher.      PLAN  Yes  Continue plan of care  Re-Cert Due: 87/69/74   [x]   Upgrade activities as tolerated  []   Discharge due to:  []   Other:      Prentice Sous, PT , DPT, Cert. DN, CRGA Level II       06/15/2024       5:37 PM              "

## 2024-06-17 ENCOUNTER — Encounter: Payer: Medicare (Managed Care) | Primary: Internal Medicine

## 2024-06-25 ENCOUNTER — Inpatient Hospital Stay: Admit: 2024-06-25 | Payer: Medicare (Managed Care) | Primary: Internal Medicine

## 2024-06-25 NOTE — Progress Notes (Signed)
 "  PHYSICAL THERAPY - MEDICARE DAILY TREATMENT NOTE (updated 3/23)      Date: 06/25/2024          Patient Name:  Darryl Adkins DOB:  1951/02/10   Medical   Diagnosis:  Cervicalgia [M54.2] Treatment Diagnosis:  M54.2  NECK PAIN    Referral Source:  Lisette Neptune, PA-C Insurance:   Payor: UHC MEDICARE / Plan: [facility] AARP MEDICARE ADVANTAGE / Product Type: *No Product type* /                     Patient DOB verified yes     Visit #   Current  / Total 5 16   Time   In / Out 11:45 am 12:35 pm   Total Treatment Time 50   Total Timed Codes 40   1:1 Treatment Time 40      [facility] BC Totals Reminder:  bill using total billable   min of TIMED therapeutic procedures and modalities.   8-22 min = 1 unit; 23-37 min = 2 units; 38-52 min = 3 units; 53-67 min = 4 units; 68-82 min = 5 units            SUBJECTIVE  If an interpreting service was utilized for treatment of this patient, the contents of this document represent the material reviewed with the patient via the interpreter.     Pain Level (0-10 scale): 2    Any medication changes, allergies to medications, adverse drug reactions, diagnosis change, or new procedure performed?: [x]  No    []  Yes (see summary sheet for update)  Medications: Verified on Patient Summary List    Subjective functional status/changes:     Pt reports improving neck pain with reduction in tightness over the last week.    OBJECTIVE      Therapeutic Procedures:  Tx Min Billable or 1:1 Min (if diff from Tx Min) Procedure, Rationale, Specifics   15  97110 Therapeutic Exercise (timed):  increase ROM, strength, coordination, balance, and proprioception to improve patient's ability to progress to PLOF and address remaining functional goals. (see flow sheet as applicable)     Details if applicable:     15  97140 Manual Therapy (timed):  decrease pain, increase ROM, and increase tissue extensibility to improve patient's ability to progress to PLOF and address remaining functional goals.  The manual therapy interventions were  performed at a separate and distinct time from the therapeutic activities interventions . (see flow sheet as applicable)     Details if applicable:  Gentle cervical distraction, passive L UT and LS stretching, SOR, STM to L UT/LS   10  97112 Neuromuscular Re-Education (timed):  improve balance, coordination, kinesthetic sense, posture, core stability and proprioception to improve patient's ability to develop conscious control of individual muscles and awareness of position of extremities in order to progress to PLOF and address remaining functional goals. (see flow sheet as applicable)    Details if applicable:           Details if applicable:            Details if applicable:     40     Total Total     Modalities Rationale:     decrease pain and increase tissue extensibility to improve patient's ability to progress to PLOF and address remaining functional goals.       min []  Estim Unattended,             type/location:       []   w/ice    []   w/heat        min []  Estim Attended,             type/location:       []   w/ice   []   w/heat         []   w/US    []   TENS insruct            min []   Mechanical Traction,        type/lbs:        []   pro      []   sup           []   int       []   cont            []   before manual           []   after manual     min []   Ultrasound,         settings/location:     10 min  unbilled []   Ice     [x]   Heat            location/position: cervical and B shoulders / Seated         min []   Vasopneumatic Device,      press/temp:   pre-treatment girth :    post-treatment girth :    measured at (landmark       location) :   If using vaso (only need to measure limb vaso being performed on)        min []   Other:      Skin assessment post-treatment (if applicable):    [x]   intact    []   redness- no adverse reaction                 [] redness - adverse reaction:          [x]   Patient Education billed concurrently with other procedures   [x]  Review HEP    []  Progressed/Changed HEP, detail:    []  Other  detail:         Other Objective/Functional Measures  Cervical AROM:                       FLEX: 60 deg   EXT: 44 deg L-side P!  R SB: 24 deg  L SB: 30 deg P!  R ROT: 64 deg *stretch  L ROT: 68 deg    Pain Level at end of session (0-10 scale): 2      Assessment   Pt encouraged to increase resistance with home exercises and emphasis on posture.  He requires VC to keep eyes up and not look down, he claims out of habit.  He demonstrates improved cervical AROM following manual therapy on this date.    Patient will continue to benefit from skilled PT / OT services to modify and progress therapeutic interventions, analyze and address functional mobility deficits, analyze and address ROM deficits, analyze and address strength deficits, analyze and address soft tissue restrictions, analyze and cue for proper movement patterns, analyze and modify for postural abnormalities, and analyze and address imbalance/dizziness to address functional deficits and attain remaining goals.    Progress toward goals / Updated goals:  []   See Progress Note/Recertification    Short Term Goals: To be accomplished in 4 treatments.  Patient will be independent with initial HEP in order to transition to general wellness program.  Patient will demonstrate cervical AROM ROT to a minimum of 65 degrees bilaterally to allow for a minimum of 10 minutes of driving with less than 7/89 pain.  Patient will report worst pain level of 2/10 or better to allow for a minimum of 8 hours of interrupted sleeping ability.  The patient will demonstrate cervical flexion AROM to 60 degrees or greater to improve ease in reading in home.  The patient will demonstrate cervical extension AROM to 45 degrees or greater to improve ease will swallowing a pill.      Long Term Goals: To be accomplished in 16 treatments.  Patient will report worst pain no greater than 2/10 to increase QOL and allow for independence with all hygienic self-care and ADL skills.  Patient will  demonstrate full cervical AROM WFL to allow for return to driving for over an hour without limitations.   Patient will demonstrate 5/5 BUE strength to allow for home cleaning skills independently and without rest breaks needed.  Patient will demonstrate pain free cervical AROM WFL to improve ease with household chores like emptying the dishwasher.      PLAN  Yes  Continue plan of care  Re-Cert Due: 87/69/74   [x]   Upgrade activities as tolerated  []   Discharge due to:  []   Other:      Prentice Sous, PT , DPT, Cert. DN, CRGA Level II       06/25/2024       11:49 AM              "

## 2024-06-30 ENCOUNTER — Inpatient Hospital Stay: Admit: 2024-06-30 | Payer: Medicare (Managed Care) | Primary: Internal Medicine

## 2024-06-30 DIAGNOSIS — M542 Cervicalgia: Principal | ICD-10-CM

## 2024-06-30 NOTE — Progress Notes (Addendum)
 "  PHYSICAL THERAPY - MEDICARE DAILY TREATMENT NOTE (updated 3/23)      Date: 06/30/2024          Patient Name:  Darryl Adkins DOB:  01-02-1951   Medical   Diagnosis:  Cervicalgia [M54.2] Treatment Diagnosis:  M54.2  NECK PAIN    Referral Source:  Lisette Neptune, PA-C Insurance:   Payor: UHC MEDICARE / Plan: [facility] AARP MEDICARE ADVANTAGE / Product Type: *No Product type* /                     Patient DOB verified yes     Visit #   Current  / Total 6 16   Time   In / Out 12:45 pm 1:35 pm   Total Treatment Time 50   Total Timed Codes 40   1:1 Treatment Time 40      [facility] BC Totals Reminder:  bill using total billable   min of TIMED therapeutic procedures and modalities.   8-22 min = 1 unit; 23-37 min = 2 units; 38-52 min = 3 units; 53-67 min = 4 units; 68-82 min = 5 units            SUBJECTIVE  If an interpreting service was utilized for treatment of this patient, the contents of this document represent the material reviewed with the patient via the interpreter.     Pain Level (0-10 scale): 2    Any medication changes, allergies to medications, adverse drug reactions, diagnosis change, or new procedure performed?: [x]  No    []  Yes (see summary sheet for update)  Medications: Verified on Patient Summary List    Subjective functional status/changes:     Pt reports improvements in neck tightness, but still some pain when he wakes up in the morning or moves quickly.    OBJECTIVE      Therapeutic Procedures:  Tx Min Billable or 1:1 Min (if diff from Tx Min) Procedure, Rationale, Specifics   15  97110 Therapeutic Exercise (timed):  increase ROM, strength, coordination, balance, and proprioception to improve patient's ability to progress to PLOF and address remaining functional goals. (see flow sheet as applicable)     Details if applicable:     15  97140 Manual Therapy (timed):  decrease pain, increase ROM, and increase tissue extensibility to improve patient's ability to progress to PLOF and address remaining functional goals.  The  manual therapy interventions were performed at a separate and distinct time from the therapeutic activities interventions . (see flow sheet as applicable)     Details if applicable:  Gentle cervical distraction, passive L UT and LS stretching, SOR, STM to L UT/LS   10  97112 Neuromuscular Re-Education (timed):  improve balance, coordination, kinesthetic sense, posture, core stability and proprioception to improve patient's ability to develop conscious control of individual muscles and awareness of position of extremities in order to progress to PLOF and address remaining functional goals. (see flow sheet as applicable)    Details if applicable:  static and dynamic balance training         Details if applicable:            Details if applicable:     40     Total Total     Modalities Rationale:     decrease pain and increase tissue extensibility to improve patient's ability to progress to PLOF and address remaining functional goals.       min []  Estim Unattended,  type/location:       []   w/ice    []   w/heat        min []  Estim Attended,             type/location:       []   w/ice   []   w/heat         []   w/US    []   TENS insruct            min []   Mechanical Traction,        type/lbs:        []   pro      []   sup           []   int       []   cont            []   before manual           []   after manual     min []   Ultrasound,         settings/location:     10 min  unbilled []   Ice     [x]   Heat            location/position: cervical and B shoulders / Seated         min []   Vasopneumatic Device,      press/temp:   pre-treatment girth :    post-treatment girth :    measured at (landmark       location) :   If using vaso (only need to measure limb vaso being performed on)        min []   Other:      Skin assessment post-treatment (if applicable):    [x]   intact    []   redness- no adverse reaction                 [] redness - adverse reaction:          [x]   Patient Education billed concurrently with other procedures    [x]  Review HEP    []  Progressed/Changed HEP, detail:    []  Other detail:         Other Objective/Functional Measures    Cervical AROM:                       FLEX: 65 deg   EXT: 40 deg  R SB: 26 deg  L SB: 31 deg P!  R ROT: 65 deg P!  L ROT: 70 deg    MMT:                                                               R                                              L              Shoulder Flexion                                 4+/5  4+/5              Shoulder Abduction                            5/5                                           5/5              Shoulder External Rotation                 4+/5                                         4+/5              Shoulder Internal Rotation                  4+/5                                         4+/5              Objective/Functional Outcome Measure:  FOTO Score: 68  FOTO score = an established functional score where 100 = no disability    Pain Level at end of session (0-10 scale): 2      Assessment   [name] has been to 6 visits of skilled therapy services for treatment of neck pain.  He reports reduced tightness and frequency of pain with ADL.  He demonstrates improved AROM and strength with objective testing on this date.  He has met 5/5 STGs and 0/4 LTGs on this date.  He continues to report discomfort with turning his head or long car/plane travel.  He will continue to benefit from skilled therapy services to reduce pain with return to ADL and improve independence with physical activity.     Patient will continue to benefit from skilled PT / OT services to modify and progress therapeutic interventions, analyze and address functional mobility deficits, analyze and address ROM deficits, analyze and address strength deficits, analyze and address soft tissue restrictions, analyze and cue for proper movement patterns, analyze and modify for postural abnormalities, and analyze and address imbalance/dizziness to address  functional deficits and attain remaining goals.    Progress toward goals / Updated goals:  []   See Progress Note/Recertification    Short Term Goals: To be accomplished in 4 treatments.  Patient will be independent with initial HEP in order to transition to general wellness program. MET  Patient will demonstrate cervical AROM ROT to a minimum of 65 degrees bilaterally to allow for a minimum of 10 minutes of driving with less than 7/89 pain. MET  Patient will report worst pain level of 2/10 or better to allow for a minimum of 8 hours of interrupted sleeping ability. MET  The patient will demonstrate cervical flexion AROM to 60 degrees or greater to improve ease in reading in home. MET  The patient will demonstrate cervical extension AROM to 45 degrees or greater to improve ease will swallowing a pill. MET     Long Term Goals: To be accomplished in 16  treatments.  Patient will report worst pain no greater than 2/10 to increase QOL and allow for independence with all hygienic self-care and ADL skills. PROGRESSING  Patient will demonstrate full cervical AROM WFL to allow for return to driving for over an hour without limitations.  PROGRESSING  Patient will demonstrate 5/5 BUE strength to allow for home cleaning skills independently and without rest breaks needed. PROGRESSING  Patient will demonstrate pain free cervical AROM WFL to improve ease with household chores like emptying the dishwasher. PROGRESSING      PLAN  Yes  Continue plan of care  Re-Cert Due: 87/69/74   [x]   Upgrade activities as tolerated  []   Discharge due to:  []   Other:      Prentice Sous, PT , DPT, Cert. DN, CRGA Level II       06/30/2024       12:45 PM              "

## 2024-06-30 NOTE — Progress Notes (Signed)
"  Physical Therapy at [facility],   a part of [facility]  [address] [place], [address]  [place], [place]  [address]  Phone: [phone]  Fax: [phone]  PHYSICAL THERAPY PROGRESS NOTE  Patient Name:  Darryl Adkins DOB:  1951/01/15   Treatment/Medical Diagnosis: Cervicalgia [M54.2]   Referral Source:  Lisette Neptune, PA-C     Date of Initial Visit:  05/26/2024 Attended Visits:  6 Missed Visits:  0     SUMMARY OF TREATMENT/ASSESSMENT:  [name] has been to 6 visits of skilled therapy services for treatment of neck pain. He reports reduced tightness and frequency of pain with ADL. He demonstrates improved AROM and strength with objective testing on this date. He has met 5/5 STGs and 0/4 LTGs on this date. He continues to report discomfort with turning his head or long car/plane travel. He will continue to benefit from skilled therapy services to reduce pain with return to ADL and improve independence with physical activity.     CURRENT STATUS/GOALS  Short Term Goals: To be accomplished in 4 treatments.  Patient will be independent with initial HEP in order to transition to general wellness program. MET  Patient will demonstrate cervical AROM ROT to a minimum of 65 degrees bilaterally to allow for a minimum of 10 minutes of driving with less than 7/89 pain. MET  Patient will report worst pain level of 2/10 or better to allow for a minimum of 8 hours of interrupted sleeping ability. MET  The patient will demonstrate cervical flexion AROM to 60 degrees or greater to improve ease in reading in home. MET  The patient will demonstrate cervical extension AROM to 45 degrees or greater to improve ease will swallowing a pill. MET     Long Term Goals: To be accomplished in 16 treatments.  Patient will report worst pain no greater than 2/10 to increase QOL and allow for independence with all hygienic self-care and ADL skills. PROGRESSING  Patient will demonstrate full cervical AROM WFL to allow  for return to driving for over an hour without limitations.  PROGRESSING  Patient will demonstrate 5/5 BUE strength to allow for home cleaning skills independently and without rest breaks needed. PROGRESSING  Patient will demonstrate pain free cervical AROM WFL to improve ease with household chores like emptying the dishwasher. PROGRESSING        RECOMMENDATIONS FOR SKILLED THERAPY  Continue 1 session per week for 4 more sessions.        Prentice Sous, PT  , DPT, Cert. DN, CRGA Level II       06/30/2024       1:40 PM    If you have any questions/comments please contact us  directly at [phone].   Thank you for allowing us  to assist in the care of your patient.  "

## 2024-07-30 ENCOUNTER — Inpatient Hospital Stay: Admit: 2024-07-30 | Payer: Medicare (Managed Care) | Primary: Internal Medicine

## 2024-07-30 DIAGNOSIS — M542 Cervicalgia: Principal | ICD-10-CM

## 2024-07-30 NOTE — Progress Notes (Signed)
"  Physical Therapy at [facility],   a part of [facility]  [address], [address]  [place], [place]  [address]  Phone: [phone]  Fax: [phone]  PHYSICAL THERAPY PROGRESS NOTE  Patient Name:  Darryl Adkins DOB:  12-Sep-1950   Treatment/Medical Diagnosis: Cervicalgia [M54.2]   Referral Source:  Lisette Neptune, PA-C     Date of Initial Visit:  05/26/2024 Attended Visits:  7 Missed Visits:  0     SUMMARY OF TREATMENT/ASSESSMENT:  Mr. Repsher has been to 7 visits of skilled therapy services for treatment of neck pain.  He reports reduced tightness and frequency of pain with ADL.  He demonstrates continued improvements in AROM on this date, but continues to require VC/TC to correct forward head posture and thoracic kyphosis resting position.  He has met 5/5 STGs and 0/4 LTGs on this date.  He continues to report discomfort with turning his head or long car/plane travel after returning from [place] .  He will continue to benefit from skilled therapy services to reduce pain with return to ADL and improve independence with physical activity.     CURRENT STATUS/GOALS  Short Term Goals: To be accomplished in 4 treatments.  Patient will be independent with initial HEP in order to transition to general wellness program. MET  Patient will demonstrate cervical AROM ROT to a minimum of 65 degrees bilaterally to allow for a minimum of 10 minutes of driving with less than 7/89 pain. MET  Patient will report worst pain level of 2/10 or better to allow for a minimum of 8 hours of interrupted sleeping ability. MET  The patient will demonstrate cervical flexion AROM to 60 degrees or greater to improve ease in reading in home. MET  The patient will demonstrate cervical extension AROM to 45 degrees or greater to improve ease will swallowing a pill. MET     Long Term Goals: To be accomplished in 16 treatments.  Patient will report worst pain no greater than 2/10 to increase QOL and allow for  independence with all hygienic self-care and ADL skills. PROGRESSING  Patient will demonstrate full cervical AROM WFL to allow for return to driving for over an hour without limitations.  PROGRESSING  Patient will demonstrate 5/5 BUE strength to allow for home cleaning skills independently and without rest breaks needed. PROGRESSING  Patient will demonstrate pain free cervical AROM WFL to improve ease with household chores like emptying the dishwasher. PROGRESSING        RECOMMENDATIONS FOR SKILLED THERAPY  Continue skilled therapy services for 1 session every other week for 4 more sessions.        Prentice Sous, PT  , DPT, Cert. DN, CRGA Level II       07/30/2024       10:48 AM    If you have any questions/comments please contact us  directly at [phone].   Thank you for allowing us  to assist in the care of your patient.  "

## 2024-07-30 NOTE — Progress Notes (Signed)
 "  PHYSICAL THERAPY - MEDICARE DAILY TREATMENT NOTE (updated 3/23)      Date: 07/30/2024          Patient Name:  Darryl Adkins DOB:  April 18, 1951   Medical   Diagnosis:  Cervicalgia [M54.2] Treatment Diagnosis:  M54.2  NECK PAIN    Referral Source:  Lisette Neptune, PA-C Insurance:   Payor: UHC MEDICARE / Plan: [facility] AARP MEDICARE ADVANTAGE / Product Type: *No Product type* /                     Patient DOB verified yes     Visit #   Current  / Total 7 16   Time   In / Out 9:30 am 10:35 am   Total Treatment Time 65   Total Timed Codes 55   1:1 Treatment Time 55      [facility] Totals Reminder:  bill using total billable   min of TIMED therapeutic procedures and modalities.   8-22 min = 1 unit; 23-37 min = 2 units; 38-52 min = 3 units; 53-67 min = 4 units; 68-82 min = 5 units            SUBJECTIVE  If an interpreting service was utilized for treatment of this patient, the contents of this document represent the material reviewed with the patient via the interpreter.     Pain Level (0-10 scale): 2    Any medication changes, allergies to medications, adverse drug reactions, diagnosis change, or new procedure performed?: [x]  No    []  Yes (see summary sheet for update)  Medications: Verified on Patient Summary List    Subjective functional status/changes:     Pt reports he has not been as compliant with home exercises because he was traveling in [place]  for two weeks.  He reports slight increase in neck stiffness, but he has felt better since getting back to his chair yoga routine and PT exercises over the past week.    OBJECTIVE      Therapeutic Procedures:  Tx Min Billable or 1:1 Min (if diff from Tx Min) Procedure, Rationale, Specifics   15  97110 Therapeutic Exercise (timed):  increase ROM, strength, coordination, balance, and proprioception to improve patient's ability to progress to PLOF and address remaining functional goals. (see flow sheet as applicable)     Details if applicable:     25  97140 Manual Therapy (timed):   decrease pain, increase ROM, and increase tissue extensibility to improve patient's ability to progress to PLOF and address remaining functional goals.  The manual therapy interventions were performed at a separate and distinct time from the therapeutic activities interventions . (see flow sheet as applicable)     Details if applicable:  Gentle cervical distraction, passive L UT and LS stretching, SOR, STM to L UT/LS, cervical side glides to promote SB   15  97112 Neuromuscular Re-Education (timed):  improve balance, coordination, kinesthetic sense, posture, core stability and proprioception to improve patient's ability to develop conscious control of individual muscles and awareness of position of extremities in order to progress to PLOF and address remaining functional goals. (see flow sheet as applicable)    Details if applicable:  static and dynamic balance training         Details if applicable:            Details if applicable:     55     Total Total     Modalities Rationale:  decrease pain and increase tissue extensibility to improve patient's ability to progress to PLOF and address remaining functional goals.       min []  Estim Unattended,             type/location:       []   w/ice    []   w/heat        min []  Estim Attended,             type/location:       []   w/ice   []   w/heat         []   w/US    []   TENS insruct            min []   Mechanical Traction,        type/lbs:        []   pro      []   sup           []   int       []   cont            []   before manual           []   after manual     min []   Ultrasound,         settings/location:     10 min  unbilled []   Ice     [x]   Heat            location/position: cervical and B shoulders / Seated         min []   Vasopneumatic Device,      press/temp:   pre-treatment girth :    post-treatment girth :    measured at (landmark       location) :   If using vaso (only need to measure limb vaso being performed on)        min []   Other:      Skin assessment  post-treatment (if applicable):    [x]   intact    []   redness- no adverse reaction                 [] redness - adverse reaction:          [x]   Patient Education billed concurrently with other procedures   [x]  Review HEP    []  Progressed/Changed HEP, detail:    []  Other detail:         Other Objective/Functional Measures    Cervical AROM:                       FLEX: 65 deg   EXT: 45 deg  R SB: 28 deg P!  L SB: 31 deg P!  R ROT: 66 deg  L ROT: 69 deg P!    Objective/Functional Outcome Measure:  FOTO Score: 67  FOTO score = an established functional score where 100 = no disability    Pain Level at end of session (0-10 scale): 2      Assessment   Darryl Adkins has been to 7 visits of skilled therapy services for treatment of neck pain.  He reports reduced tightness and frequency of pain with ADL.  He demonstrates continued improvements in AROM on this date, but continues to require VC/TC to correct forward head posture and thoracic kyphosis resting position.  He has met 5/5 STGs and 0/4 LTGs on this date.  He continues to report discomfort with turning his head or long car/plane travel after  returning from [place] .  He will continue to benefit from skilled therapy services to reduce pain with return to ADL and improve independence with physical activity.     Patient will continue to benefit from skilled PT / OT services to modify and progress therapeutic interventions, analyze and address functional mobility deficits, analyze and address ROM deficits, analyze and address strength deficits, analyze and address soft tissue restrictions, analyze and cue for proper movement patterns, analyze and modify for postural abnormalities, and analyze and address imbalance/dizziness to address functional deficits and attain remaining goals.    Progress toward goals / Updated goals:  []   See Progress Note/Recertification    Short Term Goals: To be accomplished in 4 treatments.  Patient will be independent with initial HEP in order to  transition to general wellness program. MET  Patient will demonstrate cervical AROM ROT to a minimum of 65 degrees bilaterally to allow for a minimum of 10 minutes of driving with less than 7/89 pain. MET  Patient will report worst pain level of 2/10 or better to allow for a minimum of 8 hours of interrupted sleeping ability. MET  The patient will demonstrate cervical flexion AROM to 60 degrees or greater to improve ease in reading in home. MET  The patient will demonstrate cervical extension AROM to 45 degrees or greater to improve ease will swallowing a pill. MET     Long Term Goals: To be accomplished in 16 treatments.  Patient will report worst pain no greater than 2/10 to increase QOL and allow for independence with all hygienic self-care and ADL skills. PROGRESSING  Patient will demonstrate full cervical AROM WFL to allow for return to driving for over an hour without limitations.  PROGRESSING  Patient will demonstrate 5/5 BUE strength to allow for home cleaning skills independently and without rest breaks needed. PROGRESSING  Patient will demonstrate pain free cervical AROM WFL to improve ease with household chores like emptying the dishwasher. PROGRESSING      PLAN  Yes  Continue plan of care  Re-Cert Due: 87/69/74   [x]   Upgrade activities as tolerated  []   Discharge due to:  []   Other:      Prentice Sous, PT , DPT, Cert. DN, CRGA Level II       07/30/2024       9:30 AM              "

## 2024-08-10 ENCOUNTER — Inpatient Hospital Stay: Admit: 2024-08-10 | Payer: Medicare (Managed Care) | Primary: Internal Medicine

## 2024-08-10 NOTE — Progress Notes (Addendum)
 "  PHYSICAL THERAPY - MEDICARE DAILY TREATMENT NOTE (updated 3/23)      Date: 08/10/2024          Patient Name:  Darryl Adkins DOB:  Oct 13, 1950   Medical   Diagnosis:  Cervicalgia [M54.2] Treatment Diagnosis:  M54.2  NECK PAIN    Referral Source:  Lisette Neptune, PA-C Insurance:   Payor: UHC MEDICARE / Plan: [facility] AARP MEDICARE ADVANTAGE / Product Type: *No Product type* /                     Patient DOB verified yes     Visit #   Current  / Total 8 16   Time   In / Out 7:00 am 8:00 am   Total Treatment Time 60   Total Timed Codes 45   1:1 Treatment Time 45      [facility] BC Totals Reminder:  bill using total billable   min of TIMED therapeutic procedures and modalities.   8-22 min = 1 unit; 23-37 min = 2 units; 38-52 min = 3 units; 53-67 min = 4 units; 68-82 min = 5 units            SUBJECTIVE  If an interpreting service was utilized for treatment of this patient, the contents of this document represent the material reviewed with the patient via the interpreter.     Pain Level (0-10 scale): 1-2    Any medication changes, allergies to medications, adverse drug reactions, diagnosis change, or new procedure performed?: [x]  No    []  Yes (see summary sheet for update)  Medications: Verified on Patient Summary List    Subjective functional status/changes:     Pt reports he is back to performing his chair yoga 1x/week. Pt also reporting that he is experiencing less cracking sensation at this time.     OBJECTIVE      Therapeutic Procedures:  Tx Min Billable or 1:1 Min (if diff from Tx Min) Procedure, Rationale, Specifics   15  97110 Therapeutic Exercise (timed):  increase ROM, strength, coordination, balance, and proprioception to improve patient's ability to progress to PLOF and address remaining functional goals. (see flow sheet as applicable)     Details if applicable:     15  97140 Manual Therapy (timed):  decrease pain, increase ROM, and increase tissue extensibility to improve patient's ability to progress to PLOF and address  remaining functional goals.  The manual therapy interventions were performed at a separate and distinct time from the therapeutic activities interventions . (see flow sheet as applicable)     Details if applicable:  Gentle cervical distraction, passive L UT and LS stretching, SOR, STM to L UT/LS, cervical side glides to promote SB   15  97112 Neuromuscular Re-Education (timed):  improve balance, coordination, kinesthetic sense, posture, core stability and proprioception to improve patient's ability to develop conscious control of individual muscles and awareness of position of extremities in order to progress to PLOF and address remaining functional goals. (see flow sheet as applicable)    Details if applicable:  static and dynamic balance training         Details if applicable:            Details if applicable:     45     Total Total     Modalities Rationale:     decrease pain and increase tissue extensibility to improve patient's ability to progress to PLOF and address remaining functional goals.  min []  Estim Unattended,             type/location:       []   w/ice    []   w/heat        min []  Estim Attended,             type/location:       []   w/ice   []   w/heat         []   w/US    []   TENS insruct            min []   Mechanical Traction,        type/lbs:        []   pro      []   sup           []   int       []   cont            []   before manual           []   after manual     min []   Ultrasound,         settings/location:     15 min  unbilled []   Ice     [x]   Heat            location/position: cervical and B shoulders / Seated         min []   Vasopneumatic Device,      press/temp:   pre-treatment girth :    post-treatment girth :    measured at (landmark       location) :   If using vaso (only need to measure limb vaso being performed on)        min []   Other:      Skin assessment post-treatment (if applicable):    [x]   intact    []   redness- no adverse reaction                 [] redness - adverse reaction:           [x]   Patient Education billed concurrently with other procedures   [x]  Review HEP    []  Progressed/Changed HEP, detail:    []  Other detail:         Other Objective/Functional Measures    SLS R 13s,10s,11s   L 27s, 25s 23s      Pain Level at end of session (0-10 scale): 2      Assessment   Pt demonstrates improvements with his SLS stability at this time and reports return to HEP and chair yoga weekly. Pt will continue to do well with slow progressions of his strength and mobility program as his pain and symptoms allow.     Patient will continue to benefit from skilled PT / OT services to modify and progress therapeutic interventions, analyze and address functional mobility deficits, analyze and address ROM deficits, analyze and address strength deficits, analyze and address soft tissue restrictions, analyze and cue for proper movement patterns, analyze and modify for postural abnormalities, and analyze and address imbalance/dizziness to address functional deficits and attain remaining goals.    Progress toward goals / Updated goals:  []   See Progress Note/Recertification    Short Term Goals: To be accomplished in 4 treatments.  Patient will be independent with initial HEP in order to transition to general wellness program. MET  Patient will demonstrate cervical AROM ROT to a minimum of 65 degrees bilaterally to allow for a  minimum of 10 minutes of driving with less than 7/89 pain. MET  Patient will report worst pain level of 2/10 or better to allow for a minimum of 8 hours of interrupted sleeping ability. MET  The patient will demonstrate cervical flexion AROM to 60 degrees or greater to improve ease in reading in home. MET  The patient will demonstrate cervical extension AROM to 45 degrees or greater to improve ease will swallowing a pill. MET     Long Term Goals: To be accomplished in 16 treatments.  Patient will report worst pain no greater than 2/10 to increase QOL and allow for independence with all  hygienic self-care and ADL skills. PROGRESSING  Patient will demonstrate full cervical AROM WFL to allow for return to driving for over an hour without limitations.  PROGRESSING  Patient will demonstrate 5/5 BUE strength to allow for home cleaning skills independently and without rest breaks needed. PROGRESSING  Patient will demonstrate pain free cervical AROM WFL to improve ease with household chores like emptying the dishwasher. PROGRESSING      PLAN  Yes  Continue plan of care  Re-Cert Due: 87/69/74   [x]   Upgrade activities as tolerated  []   Discharge due to:  []   Other:      Norman Lesches, PTA      08/10/2024       7:02 AM              "

## 2024-08-24 ENCOUNTER — Inpatient Hospital Stay: Admit: 2024-08-24 | Payer: Medicare (Managed Care) | Primary: Internal Medicine

## 2024-08-24 NOTE — Progress Notes (Signed)
 "  PHYSICAL THERAPY - MEDICARE DAILY TREATMENT NOTE (updated 3/23)      Date: 08/24/2024          Patient Name:  Darryl Adkins DOB:  07-21-1951   Medical   Diagnosis:  Cervicalgia [M54.2] Treatment Diagnosis:  M54.2  NECK PAIN    Referral Source:  Lisette Neptune, PA-C Insurance:   Payor: UHC MEDICARE / Plan: [facility] AARP MEDICARE ADVANTAGE / Product Type: *No Product type* /                     Patient DOB verified yes     Visit #   Current  / Total 9 16   Time   In / Out 8:30 am 9:25 am   Total Treatment Time 55   Total Timed Codes 45   1:1 Treatment Time 45      [facility] Totals Reminder:  bill using total billable   min of TIMED therapeutic procedures and modalities.   8-22 min = 1 unit; 23-37 min = 2 units; 38-52 min = 3 units; 53-67 min = 4 units; 68-82 min = 5 units            SUBJECTIVE  If an interpreting service was utilized for treatment of this patient, the contents of this document represent the material reviewed with the patient via the interpreter.     Pain Level (0-10 scale): 1-2    Any medication changes, allergies to medications, adverse drug reactions, diagnosis change, or new procedure performed?: [x]  No    []  Yes (see summary sheet for update)  Medications: Verified on Patient Summary List    Subjective functional status/changes:     Pt reports continued improvements in neck pain and mobility with less discomfort while driving.    OBJECTIVE      Therapeutic Procedures:  Tx Min Billable or 1:1 Min (if diff from Tx Min) Procedure, Rationale, Specifics   15  97110 Therapeutic Exercise (timed):  increase ROM, strength, coordination, balance, and proprioception to improve patient's ability to progress to PLOF and address remaining functional goals. (see flow sheet as applicable)     Details if applicable:     20  97140 Manual Therapy (timed):  decrease pain, increase ROM, and increase tissue extensibility to improve patient's ability to progress to PLOF and address remaining functional goals.  The manual therapy  interventions were performed at a separate and distinct time from the therapeutic activities interventions . (see flow sheet as applicable)     Details if applicable:  Gentle cervical distraction, passive L UT and LS stretching, SOR, STM to L UT/LS, cervical side glides to promote SB   10  97112 Neuromuscular Re-Education (timed):  improve balance, coordination, kinesthetic sense, posture, core stability and proprioception to improve patient's ability to develop conscious control of individual muscles and awareness of position of extremities in order to progress to PLOF and address remaining functional goals. (see flow sheet as applicable)    Details if applicable:  static and dynamic balance training         Details if applicable:            Details if applicable:     45     Total Total     Modalities Rationale:     decrease pain and increase tissue extensibility to improve patient's ability to progress to PLOF and address remaining functional goals.       min []  Estim Unattended,  type/location:       []   w/ice    []   w/heat        min []  Estim Attended,             type/location:       []   w/ice   []   w/heat         []   w/US    []   TENS insruct            min []   Mechanical Traction,        type/lbs:        []   pro      []   sup           []   int       []   cont            []   before manual           []   after manual     min []   Ultrasound,         settings/location:     10 min  unbilled []   Ice     [x]   Heat            location/position: cervical and B shoulders / Seated         min []   Vasopneumatic Device,      press/temp:   pre-treatment girth :    post-treatment girth :    measured at (landmark       location) :   If using vaso (only need to measure limb vaso being performed on)        min []   Other:      Skin assessment post-treatment (if applicable):    [x]   intact    []   redness- no adverse reaction                 [] redness - adverse reaction:          [x]   Patient Education billed concurrently  with other procedures   [x]  Review HEP    []  Progressed/Changed HEP, detail:    []  Other detail:         Other Objective/Functional Measures    Cervical AROM:                       FLEX: 65 deg   EXT: 48 deg  R SB: 30 deg P!  L SB: 34 deg   R ROT: 65 deg  L ROT: 63 deg P!     Single Leg Stance (L: 19 sec   R: 21 sec )    Objective/Functional Outcome Measure:  FOTO Score: 66  FOTO score = an established functional score where 100 = no disability    Pain Level at end of session (0-10 scale): 1      Assessment   [name] has been to 9 visits of skilled therapy services for treatment of neck pain.  He reports reduced tightness and frequency of pain with ADL.  He demonstrates continued improvements in AROM on this date, but continues to require VC/TC to correct posture and form with exercises.  He has met 5/5 STGs and 0/4 LTGs on this date.  He continues to report discomfort with turning his head while driving his car.  He will continue to benefit from skilled therapy services to reduce pain with return to ADL and improve independence with physical activity.     Patient will  continue to benefit from skilled PT / OT services to modify and progress therapeutic interventions, analyze and address functional mobility deficits, analyze and address ROM deficits, analyze and address strength deficits, analyze and address soft tissue restrictions, analyze and cue for proper movement patterns, analyze and modify for postural abnormalities, and analyze and address imbalance/dizziness to address functional deficits and attain remaining goals.    Progress toward goals / Updated goals:  []   See Progress Note/Recertification    Short Term Goals: To be accomplished in 4 treatments.  Patient will be independent with initial HEP in order to transition to general wellness program. MET  Patient will demonstrate cervical AROM ROT to a minimum of 65 degrees bilaterally to allow for a minimum of 10 minutes of driving with less than 7/89 pain.  MET  Patient will report worst pain level of 2/10 or better to allow for a minimum of 8 hours of interrupted sleeping ability. MET  The patient will demonstrate cervical flexion AROM to 60 degrees or greater to improve ease in reading in home. MET  The patient will demonstrate cervical extension AROM to 45 degrees or greater to improve ease will swallowing a pill. MET     Long Term Goals: To be accomplished in 16 treatments.  Patient will report worst pain no greater than 2/10 to increase QOL and allow for independence with all hygienic self-care and ADL skills. PROGRESSING  Patient will demonstrate full cervical AROM WFL to allow for return to driving for over an hour without limitations.  PROGRESSING  Patient will demonstrate 5/5 BUE strength to allow for home cleaning skills independently and without rest breaks needed. PROGRESSING  Patient will demonstrate pain free cervical AROM WFL to improve ease with household chores like emptying the dishwasher. PROGRESSING      PLAN  Yes  Continue plan of care  Re-Cert Due: 96/69/73   [x]   Upgrade activities as tolerated  []   Discharge due to:  []   Other:      Prentice Sous, PT , DPT, Cert. DN, CRGA Level II       08/24/2024       8:28 AM              "

## 2024-08-24 NOTE — Therapy Recertification (Cosign Needed)
 "Physical Therapy at [place],   a part of [facility]  [address] [place], [address]  [place], [place]  [address]  Phone: [phone]  Fax: [phone]  CONTINUED PLAN OF CARE/RECERTIFICATION FOR PHYSICAL THERAPY          Patient Name:              Darryl Adkins DOB:  July 07, 1951   Treatment/Medical Diagnosis:  Cervicalgia [M54.2]   Onset Date:  01/25/2024    Referral Source:  Lisette Josette RIGGERS Start of Care [facility]):  05/26/2024   Prior Hospitalization:  See Medical History Provider #:  [id number]      Prior Level of Function (PLOF):  Independent with all chores and ADL    Comorbidities:  Past Medical History:  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  Patient will be independent with initial HEP in order to transition to general wellness program. MET  Patient will demonstrate cervical AROM ROT to a minimum of 65 degrees bilaterally to allow for a minimum of 10 minutes of driving with less than 7/89 pain. MET  Patient will report worst pain level of 2/10 or better to allow for a minimum of 8 hours of interrupted sleeping ability. MET  The patient will demonstrate cervical flexion AROM to 60 degrees or greater to improve ease in reading in home. MET  The patient will demonstrate cervical extension AROM to 45 degrees or greater to improve ease will swallowing a pill. MET    Key Functional Changes/Progress: Improved postural awareness, reduced pain with sleeping and driving  Problem List: pain affecting function, decrease ROM, decrease strength, impaired gait/balance, decrease ADL/functional abilities, decrease activity tolerance, decrease flexibility/joint mobility, and decrease transfer abilities    Treatment Plan may include any  combination of the following: 02889 Therapeutic Exercise, 97112 Neuromuscular Re-Education, 97140 Manual Therapy, 97530 Therapeutic Activity, 97535 Self Care/Home Management, 97014 Electrical Stim unattended / [phone] [facility]), 97016 Vasopneumatic Device  (Vasopnuematic compression justification:  Per bilateral girth measures taken and listed above the edema is considered significant and having an impact on the patient's strength, balance, gait, transfers, self care, and ADL's), and (Elective Self Pay) Needle Insertion w/o Injection (1 or 2 muscles), (3+ muscles)  Patient Goal(s) has been updated and includes: see below    Goals for this certification period include and are to be achieved in   8  treatments:    Long Term Goals: To be accomplished in 16 treatments.  Patient will report worst pain no greater than 2/10 to increase QOL and allow for independence with all hygienic self-care and ADL skills. PROGRESSING  Patient will demonstrate full cervical AROM WFL to allow for return to driving for over an hour without limitations.  PROGRESSING  Patient will demonstrate 5/5 BUE strength to allow for home cleaning skills independently and without rest breaks needed. PROGRESSING  Patient will demonstrate pain free cervical AROM WFL to improve ease with household chores like emptying the dishwasher. PROGRESSING    Frequency / Duration:   Patient to be seen   1   times per week for   8   treatments:      Assessments/Recommendations for Continuation of Care:  Pt demonstrates improvements with his SLS stability at this time and reports return to HEP and chair yoga weekly. Pt will continue to do well with slow progressions of his strength and mobility program as his pain and symptoms allow.     If you have any questions/comments please contact us  directly at [phone].   Thank you for allowing us  to assist in the care of your patient.    Certification Period: 08/24/2024 - 11/22/24      Prentice Sous, PT , DPT, Cert. DN, CRGA  Level II        08/24/2024       9:16 AM      ___ I have read the above report and request that my patient continue as recommended.   ___ I have read the above report and request that my patient continue therapy with the following changes/special instructions: ________________________________________________   ___ I have read the above report and request that my patient be discharged from therapy.     Physician's Signature:_________________________   DATE:_________   TIME:________                           Lisette Neptune, PA-C    ** Signature, Date and Time must be completed for valid certification **  Please sign and fax to [phone].  Thank you  "

## 2024-09-02 ENCOUNTER — Inpatient Hospital Stay: Admit: 2024-09-02 | Payer: Medicare (Managed Care) | Primary: Internal Medicine

## 2024-09-02 DIAGNOSIS — M542 Cervicalgia: Principal | ICD-10-CM

## 2024-09-02 NOTE — Progress Notes (Signed)
 PHYSICAL THERAPY - MEDICARE DAILY TREATMENT NOTE (updated 3/23)      Date: 09/02/2024          Patient Name:  Darryl Adkins DOB:  10/01/1950   Medical   Diagnosis:  Cervicalgia [M54.2] Treatment Diagnosis:  M54.2  NECK PAIN    Referral Source:  Lisette Neptune, PA-C Insurance:   Payor: UHC MEDICARE / Plan: [facility] AARP MEDICARE ADVANTAGE / Product Type: *No Product type* /                     Patient DOB verified yes     Visit #   Current  / Total 10 16   Time   In / Out 9:00 am 10:00 am   Total Treatment Time 60   Total Timed Codes 45   1:1 Treatment Time 45      [facility] BC Totals Reminder:  bill using total billable   min of TIMED therapeutic procedures and modalities.   8-22 min = 1 unit; 23-37 min = 2 units; 38-52 min = 3 units; 53-67 min = 4 units; 68-82 min = 5 units            SUBJECTIVE  If an interpreting service was utilized for treatment of this patient, the contents of this document represent the material reviewed with the patient via the interpreter.     Pain Level (0-10 scale): 1    Any medication changes, allergies to medications, adverse drug reactions, diagnosis change, or new procedure performed?: [x]  No    []  Yes (see summary sheet for update)  Medications: Verified on Patient Summary List    Subjective functional status/changes:     Pt reports the last few days he has been leaning forward and tripping on objects.  He is more concerned with balance right now.  His neck is feeling better.    OBJECTIVE      Therapeutic Procedures:  Tx Min Billable or 1:1 Min (if diff from Tx Min) Procedure, Rationale, Specifics   15  97110 Therapeutic Exercise (timed):  increase ROM, strength, coordination, balance, and proprioception to improve patient's ability to progress to PLOF and address remaining functional goals. (see flow sheet as applicable)     Details if applicable:     15  97140 Manual Therapy (timed):  decrease pain, increase ROM, and increase tissue extensibility to improve patient's ability to progress to PLOF  and address remaining functional goals.  The manual therapy interventions were performed at a separate and distinct time from the therapeutic activities interventions . (see flow sheet as applicable)     Details if applicable:  Gentle cervical distraction, passive L UT and LS stretching, SOR, STM to L UT/LS, cervical side glides to promote SB   15  97112 Neuromuscular Re-Education (timed):  improve balance, coordination, kinesthetic sense, posture, core stability and proprioception to improve patient's ability to develop conscious control of individual muscles and awareness of position of extremities in order to progress to PLOF and address remaining functional goals. (see flow sheet as applicable)    Details if applicable:  static and dynamic balance training         Details if applicable:            Details if applicable:     45     Total Total     Modalities Rationale:     decrease pain and increase tissue extensibility to improve patient's ability to progress to PLOF and address remaining functional  goals.       min []  Estim Unattended,             type/location:       []   w/ice    []   w/heat        min []  Estim Attended,             type/location:       []   w/ice   []   w/heat         []   w/US    []   TENS insruct            min []   Mechanical Traction,        type/lbs:        []   pro      []   sup           []   int       []   cont            []   before manual           []   after manual     min []   Ultrasound,         settings/location:     15 min  unbilled []   Ice     [x]   Heat            location/position: cervical and B shoulders / Seated         min []   Vasopneumatic Device,      press/temp:   pre-treatment girth :    post-treatment girth :    measured at (landmark       location) :   If using vaso (only need to measure limb vaso being performed on)        min []   Other:      Skin assessment post-treatment (if applicable):    [x]   intact    []   redness- no adverse reaction                 [] redness - adverse  reaction:          [x]   Patient Education billed concurrently with other procedures   [x]  Review HEP    []  Progressed/Changed HEP, detail:    []  Other detail:         Other Objective/Functional Measures    SLS R: 30 seconds   L: 25 seconds      Pain Level at end of session (0-10 scale): 1      Assessment   Pt demonstrates continued improvements with single limb stability and was encouraged to keep his eyes up and focus on posture while walking and negotiating his environment.  He tends to drag his toes and lean forward when not focusing and this can lead to loss of balance.  He tolerated increased challenge with dynamic stability program on this date.    Patient will continue to benefit from skilled PT / OT services to modify and progress therapeutic interventions, analyze and address functional mobility deficits, analyze and address ROM deficits, analyze and address strength deficits, analyze and address soft tissue restrictions, analyze and cue for proper movement patterns, analyze and modify for postural abnormalities, and analyze and address imbalance/dizziness to address functional deficits and attain remaining goals.    Progress toward goals / Updated goals:  []   See Progress Note/Recertification    Short Term Goals: To be accomplished in 4 treatments.  Patient will be independent with initial HEP in order  to transition to general wellness program. MET  Patient will demonstrate cervical AROM ROT to a minimum of 65 degrees bilaterally to allow for a minimum of 10 minutes of driving with less than 7/89 pain. MET  Patient will report worst pain level of 2/10 or better to allow for a minimum of 8 hours of interrupted sleeping ability. MET  The patient will demonstrate cervical flexion AROM to 60 degrees or greater to improve ease in reading in home. MET  The patient will demonstrate cervical extension AROM to 45 degrees or greater to improve ease will swallowing a pill. MET     Long Term Goals: To be accomplished  in 16 treatments.  Patient will report worst pain no greater than 2/10 to increase QOL and allow for independence with all hygienic self-care and ADL skills. PROGRESSING  Patient will demonstrate full cervical AROM WFL to allow for return to driving for over an hour without limitations.  PROGRESSING  Patient will demonstrate 5/5 BUE strength to allow for home cleaning skills independently and without rest breaks needed. PROGRESSING  Patient will demonstrate pain free cervical AROM WFL to improve ease with household chores like emptying the dishwasher. PROGRESSING      PLAN  Yes  Continue plan of care  Re-Cert Due: 6/69/73   [x]   Upgrade activities as tolerated  []   Discharge due to:  []   Other:      Prentice Sous, PT , DPT, Cert. DN, CRGA Level II       09/02/2024       9:04 AM

## 2024-09-09 ENCOUNTER — Inpatient Hospital Stay: Admit: 2024-09-09 | Payer: Medicare (Managed Care) | Primary: Internal Medicine

## 2024-09-09 NOTE — Progress Notes (Addendum)
 PHYSICAL THERAPY - MEDICARE DAILY TREATMENT NOTE (updated 3/23)      Date: 09/09/2024          Patient Name:  Darryl Adkins DOB:  [birth year]   Medical   Diagnosis:  Cervicalgia [M54.2] Treatment Diagnosis:  M54.2  NECK PAIN    Referral Source:  Lisette Neptune, PA-C Insurance:   Payor: [employer] MEDICARE / Plan: [facility] A[employer] MEDICARE ADVANTAGE / Product Type: *No Product type* /                     Patient DOB verified yes     Visit #   Current  / Total 11 16   Time   In / Out 8:55 am 9:55 am   Total Treatment Time 60   Total Timed Codes 45   1:1 Treatment Time 45      [facility] Totals Reminder:  bill using total billable   min of TIMED therapeutic procedures and modalities.   8-22 min = 1 unit; 23-37 min = 2 units; 38-52 min = 3 units; 53-67 min = 4 units; 68-82 min = 5 units            SUBJECTIVE  If an interpreting service was utilized for treatment of this patient, the contents of this document represent the material reviewed with the patient via the interpreter.     Pain Level (0-10 scale): 1    Any medication changes, allergies to medications, adverse drug reactions, diagnosis change, or new procedure performed?: [x]  No    []  Yes (see summary sheet for update)  Medications: Verified on Patient Summary List    Subjective functional status/changes:     Pt reports he is cold today, but the neck continues to feel better.  He has been working on his balance and continues to go to chair yoga and the gym.    OBJECTIVE      Therapeutic Procedures:  Tx Min Billable or 1:1 Min (if diff from Tx Min) Procedure, Rationale, Specifics   15  97110 Therapeutic Exercise (timed):  increase ROM, strength, coordination, balance, and proprioception to improve patient's ability to progress to PLOF and address remaining functional goals. (see flow sheet as applicable)     Details if applicable:     20  97140 Manual Therapy (timed):  decrease pain, increase ROM, and increase tissue extensibility to improve patient's ability to progress to PLOF and  address remaining functional goals.  The manual therapy interventions were performed at a separate and distinct time from the therapeutic activities interventions . (see flow sheet as applicable)     Details if applicable:  Gentle cervical distraction, passive L UT and LS stretching, SOR, STM to L UT/LS, cervical side glides to promote SB   10  97112 Neuromuscular Re-Education (timed):  improve balance, coordination, kinesthetic sense, posture, core stability and proprioception to improve patient's ability to develop conscious control of individual muscles and awareness of position of extremities in order to progress to PLOF and address remaining functional goals. (see flow sheet as applicable)    Details if applicable:  static and dynamic balance training         Details if applicable:            Details if applicable:     45     Total Total     Modalities Rationale:     decrease pain and increase tissue extensibility to improve patient's ability to progress to PLOF and address remaining  functional goals.       min []  Estim Unattended,             type/location:       []   w/ice    []   w/heat        min []  Estim Attended,             type/location:       []   w/ice   []   w/heat         []   w/US    []   TENS insruct            min []   Mechanical Traction,        type/lbs:        []   pro      []   sup           []   int       []   cont            []   before manual           []   after manual     min []   Ultrasound,         settings/location:     15 min  unbilled []   Ice     [x]   Heat            location/position: cervical and B shoulders / Seated         min []   Vasopneumatic Device,      press/temp:   pre-treatment girth :    post-treatment girth :    measured at (landmark       location) :   If using vaso (only need to measure limb vaso being performed on)        min []   Other:      Skin assessment post-treatment (if applicable):    [x]   intact    []   redness- no adverse reaction                 [] redness - adverse  reaction:          [x]   Patient Education billed concurrently with other procedures   [x]  Review HEP    []  Progressed/Changed HEP, detail:    []  Other detail:         Other Objective/Functional Measures    Cervical AROM:                       FLEX: 65 deg   EXT: 46 deg  R SB: 28 deg *tight  L SB: 34 deg   R ROT: 62 deg *tight  L ROT: 65 deg    SLS R: 30 seconds   L: 21 seconds    Pain Level at end of session (0-10 scale): 1    Assessment   Pt reports improved awareness over cervical posture and goal to keep eyes up while walking and sitting during the day.  He continues to demonstrate improvements in static and dynamic balance with report of compliance with physical activity recommendations at home.    Patient will continue to benefit from skilled PT / OT services to modify and progress therapeutic interventions, analyze and address functional mobility deficits, analyze and address ROM deficits, analyze and address strength deficits, analyze and address soft tissue restrictions, analyze and cue for proper movement patterns, analyze and modify for postural abnormalities, and analyze and address imbalance/dizziness to address functional deficits and attain remaining goals.  Progress toward goals / Updated goals:  []   See Progress Note/Recertification    Short Term Goals: To be accomplished in 4 treatments.  Patient will be independent with initial HEP in order to transition to general wellness program. MET  Patient will demonstrate cervical AROM ROT to a minimum of 65 degrees bilaterally to allow for a minimum of 10 minutes of driving with less than 7/89 pain. MET  Patient will report worst pain level of 2/10 or better to allow for a minimum of 8 hours of interrupted sleeping ability. MET  The patient will demonstrate cervical flexion AROM to 60 degrees or greater to improve ease in reading in home. MET  The patient will demonstrate cervical extension AROM to 45 degrees or greater to improve ease will swallowing a  pill. MET     Long Term Goals: To be accomplished in 16 treatments.  Patient will report worst pain no greater than 2/10 to increase QOL and allow for independence with all hygienic self-care and ADL skills. PROGRESSING  Patient will demonstrate full cervical AROM WFL to allow for return to driving for over an hour without limitations.  PROGRESSING  Patient will demonstrate 5/5 BUE strength to allow for home cleaning skills independently and without rest breaks needed. PROGRESSING  Patient will demonstrate pain free cervical AROM WFL to improve ease with household chores like emptying the dishwasher. PROGRESSING      PLAN  Yes  Continue plan of care  Re-Cert Due: 6/69/73   [x]   Upgrade activities as tolerated  []   Discharge due to:  []   Other:      Prentice Sous, PT , DPT, Cert. DN, CRGA Level II       09/09/2024       8:57 AM

## 2024-09-16 ENCOUNTER — Inpatient Hospital Stay: Admit: 2024-09-16 | Payer: Medicare (Managed Care) | Primary: Internal Medicine

## 2024-09-16 NOTE — Progress Notes (Signed)
 PHYSICAL THERAPY - MEDICARE DAILY TREATMENT NOTE (updated 3/23)      Date: 09/16/2024          Patient Name:  Darryl Adkins DOB:  Jan 24, 1951   Medical   Diagnosis:  Cervicalgia [M54.2] Treatment Diagnosis:  M54.2  NECK PAIN    Referral Source:  Lisette Neptune, PA-C Insurance:   Payor: [employer]HC MEDICARE / Plan: [facility] AARP MEDICARE ADVANTAGE / Product Type: *No Product type* /                     Patient DOB verified yes     Visit #   Current  / Total 12 16   Time   In / Out 9:05 10:00 am   Total Treatment Time 55   Total Timed Codes 40   1:1 Treatment Time 40      [facility] Totals Reminder:  bill using total billable   min of TIMED therapeutic procedures and modalities.   8-22 min = 1 unit; 23-37 min = 2 units; 38-52 min = 3 units; 53-67 min = 4 units; 68-82 min = 5 units            SUBJECTIVE  If an interpreting service was utilized for treatment of this patient, the contents of this document represent the material reviewed with the patient via the interpreter.     Pain Level (0-10 scale): 1    Any medication changes, allergies to medications, adverse drug reactions, diagnosis change, or new procedure performed?: [x]  No    []  Yes (see summary sheet for update)  Medications: Verified on Patient Summary List    Subjective functional status/changes:     Pt reports the neck feels pretty good when he leaves therapy.  He has had some shoulder pain at the gym with specific exercises, but feels good today.    OBJECTIVE      Therapeutic Procedures:  Tx Min Billable or 1:1 Min (if diff from Tx Min) Procedure, Rationale, Specifics   15  97110 Therapeutic Exercise (timed):  increase ROM, strength, coordination, balance, and proprioception to improve patient's ability to progress to PLOF and address remaining functional goals. (see flow sheet as applicable)     Details if applicable:     15  97140 Manual Therapy (timed):  decrease pain, increase ROM, and increase tissue extensibility to improve patient's ability to progress to PLOF and  address remaining functional goals.  The manual therapy interventions were performed at a separate and distinct time from the therapeutic activities interventions . (see flow sheet as applicable)     Details if applicable:  Gentle cervical distraction, passive L UT and LS stretching, SOR, STM to L UT/LS, cervical side glides to promote SB   10  97112 Neuromuscular Re-Education (timed):  improve balance, coordination, kinesthetic sense, posture, core stability and proprioception to improve patient's ability to develop conscious control of individual muscles and awareness of position of extremities in order to progress to PLOF and address remaining functional goals. (see flow sheet as applicable)    Details if applicable:  static and dynamic balance training         Details if applicable:            Details if applicable:     40     Total Total     Modalities Rationale:     decrease pain and increase tissue extensibility to improve patient's ability to progress to PLOF and address remaining functional goals.  min []  Estim Unattended,             type/location:       []   w/ice    []   w/heat        min []  Estim Attended,             type/location:       []   w/ice   []   w/heat         []   w/US    []   TENS insruct            min []   Mechanical Traction,        type/lbs:        []   pro      []   sup           []   int       []   cont            []   before manual           []   after manual     min []   Ultrasound,         settings/location:     15 min  unbilled []   Ice     [x]   Heat            location/position: cervical and B shoulders / Seated         min []   Vasopneumatic Device,      press/temp:   pre-treatment girth :    post-treatment girth :    measured at (landmark       location) :   If using vaso (only need to measure limb vaso being performed on)        min []   Other:      Skin assessment post-treatment (if applicable):    [x]   intact    []   redness- no adverse reaction                 [] redness - adverse  reaction:          [x]   Patient Education billed concurrently with other procedures   [x]  Review HEP    []  Progressed/Changed HEP, detail:    []  Other detail:         Other Objective/Functional Measures    Cervical AROM:                       FLEX: 65 deg   EXT: 46 deg  R SB: 30 deg *tight  L SB: 35 deg   R ROT: 63 deg *tight  L ROT: 66 deg    SLS R: 30 seconds   L: 25 seconds    Pain Level at end of session (0-10 scale): 1    Assessment   Pt reports renewed focus on keeping eyes up with walking and standing exercises.  He demonstrates improved single limb balance on this date and will demonstrate from re-evaluation at next visit to determine final home exercise program.    Patient will continue to benefit from skilled PT / OT services to modify and progress therapeutic interventions, analyze and address functional mobility deficits, analyze and address ROM deficits, analyze and address strength deficits, analyze and address soft tissue restrictions, analyze and cue for proper movement patterns, analyze and modify for postural abnormalities, and analyze and address imbalance/dizziness to address functional deficits and attain remaining goals.    Progress toward goals / Updated goals:  []   See Progress Note/Recertification    Short Term Goals: To be accomplished in 4 treatments.  Patient will be independent with initial HEP in order to transition to general wellness program. MET  Patient will demonstrate cervical AROM ROT to a minimum of 65 degrees bilaterally to allow for a minimum of 10 minutes of driving with less than 7/89 pain. MET  Patient will report worst pain level of 2/10 or better to allow for a minimum of 8 hours of interrupted sleeping ability. MET  The patient will demonstrate cervical flexion AROM to 60 degrees or greater to improve ease in reading in home. MET  The patient will demonstrate cervical extension AROM to 45 degrees or greater to improve ease will swallowing a pill. MET     Long Term Goals:  To be accomplished in 16 treatments.  Patient will report worst pain no greater than 2/10 to increase QOL and allow for independence with all hygienic self-care and ADL skills. PROGRESSING  Patient will demonstrate full cervical AROM WFL to allow for return to driving for over an hour without limitations.  PROGRESSING  Patient will demonstrate 5/5 BUE strength to allow for home cleaning skills independently and without rest breaks needed. PROGRESSING  Patient will demonstrate pain free cervical AROM WFL to improve ease with household chores like emptying the dishwasher. PROGRESSING      PLAN  Yes  Continue plan of care  Re-Cert Due: 6/69/73   [x]   Upgrade activities as tolerated  []   Discharge due to:  []   Other:      Prentice Sous, PT , DPT, Cert. DN, CRGA Level II       09/16/2024       9:07 AM

## 2024-09-23 ENCOUNTER — Encounter: Payer: Medicare (Managed Care) | Primary: Internal Medicine

## 2024-09-24 ENCOUNTER — Encounter: Payer: Medicare (Managed Care) | Primary: Internal Medicine

## 2024-09-27 ENCOUNTER — Inpatient Hospital Stay: Admit: 2024-09-27 | Payer: Medicare (Managed Care) | Primary: Internal Medicine

## 2024-10-22 ENCOUNTER — Encounter: Payer: Medicare (Managed Care) | Primary: Internal Medicine
